# Patient Record
Sex: Female | Born: 1955
Health system: Southern US, Community
[De-identification: ages and names within clinical notes are randomized; demographics above are authoritative.]

## PROBLEM LIST (undated history)

## (undated) DIAGNOSIS — F329 Major depressive disorder, single episode, unspecified: Secondary | ICD-10-CM

## (undated) DIAGNOSIS — F32A Depression, unspecified: Secondary | ICD-10-CM

## (undated) DIAGNOSIS — F410 Panic disorder [episodic paroxysmal anxiety] without agoraphobia: Secondary | ICD-10-CM

## (undated) DIAGNOSIS — I10 Essential (primary) hypertension: Secondary | ICD-10-CM

## (undated) DIAGNOSIS — F419 Anxiety disorder, unspecified: Secondary | ICD-10-CM

## (undated) DIAGNOSIS — M1990 Unspecified osteoarthritis, unspecified site: Secondary | ICD-10-CM

## (undated) DIAGNOSIS — F1910 Other psychoactive substance abuse, uncomplicated: Secondary | ICD-10-CM

## (undated) DIAGNOSIS — E7800 Pure hypercholesterolemia, unspecified: Secondary | ICD-10-CM

## (undated) DIAGNOSIS — G8929 Other chronic pain: Secondary | ICD-10-CM

## (undated) HISTORY — PX: KNEE SURGERY: SHX244

## (undated) HISTORY — PX: TUBAL LIGATION: SHX77

## (undated) HISTORY — PX: OTHER SURGICAL HISTORY: SHX169

## (undated) HISTORY — PX: CHOLECYSTECTOMY: SHX55

## (undated) HISTORY — PX: CERVICAL FUSION: SHX112

## (undated) HISTORY — PX: APPENDECTOMY: SHX54

---

## 2017-05-16 ENCOUNTER — Emergency Department (HOSPITAL_BASED_OUTPATIENT_CLINIC_OR_DEPARTMENT_OTHER)
Admission: EM | Admit: 2017-05-16 | Discharge: 2017-05-16 | Disposition: A | Discharge status: Home / Self Care | Payer: BLUE CROSS/BLUE SHIELD | Attending: Emergency Medicine | Admitting: Emergency Medicine

## 2017-05-16 ENCOUNTER — Encounter (HOSPITAL_BASED_OUTPATIENT_CLINIC_OR_DEPARTMENT_OTHER): Payer: Self-pay | Admitting: *Deleted

## 2017-05-16 DIAGNOSIS — I10 Essential (primary) hypertension: Secondary | ICD-10-CM | POA: Diagnosis not present

## 2017-05-16 DIAGNOSIS — F419 Anxiety disorder, unspecified: Secondary | ICD-10-CM | POA: Diagnosis present

## 2017-05-16 DIAGNOSIS — R202 Paresthesia of skin: Secondary | ICD-10-CM | POA: Diagnosis not present

## 2017-05-16 DIAGNOSIS — Z79899 Other long term (current) drug therapy: Secondary | ICD-10-CM | POA: Insufficient documentation

## 2017-05-16 HISTORY — DX: Panic disorder (episodic paroxysmal anxiety): F41.0

## 2017-05-16 HISTORY — DX: Unspecified osteoarthritis, unspecified site: M19.90

## 2017-05-16 HISTORY — DX: Other psychoactive substance abuse, uncomplicated: F19.10

## 2017-05-16 HISTORY — DX: Essential (primary) hypertension: I10

## 2017-05-16 HISTORY — DX: Major depressive disorder, single episode, unspecified: F32.9

## 2017-05-16 HISTORY — DX: Other chronic pain: G89.29

## 2017-05-16 HISTORY — DX: Depression, unspecified: F32.A

## 2017-05-16 HISTORY — DX: Pure hypercholesterolemia, unspecified: E78.00

## 2017-05-16 HISTORY — DX: Anxiety disorder, unspecified: F41.9

## 2017-05-16 LAB — BASIC METABOLIC PANEL
ANION GAP: 10 (ref 5–15)
BUN: 15 mg/dL (ref 6–20)
CALCIUM: 8.8 mg/dL — AB (ref 8.9–10.3)
CO2: 27 mmol/L (ref 22–32)
Chloride: 103 mmol/L (ref 101–111)
Creatinine, Ser: 0.73 mg/dL (ref 0.44–1.00)
GLUCOSE: 111 mg/dL — AB (ref 65–99)
Potassium: 4 mmol/L (ref 3.5–5.1)
Sodium: 140 mmol/L (ref 135–145)

## 2017-05-16 LAB — CBC WITH DIFFERENTIAL/PLATELET
BASOS PCT: 0 %
Basophils Absolute: 0 10*3/uL (ref 0.0–0.1)
EOS ABS: 0 10*3/uL (ref 0.0–0.7)
EOS PCT: 1 %
HCT: 40.9 % (ref 36.0–46.0)
HEMOGLOBIN: 13.3 g/dL (ref 12.0–15.0)
Lymphocytes Relative: 29 %
Lymphs Abs: 2.3 10*3/uL (ref 0.7–4.0)
MCH: 32.3 pg (ref 26.0–34.0)
MCHC: 32.5 g/dL (ref 30.0–36.0)
MCV: 99.3 fL (ref 78.0–100.0)
MONO ABS: 0.6 10*3/uL (ref 0.1–1.0)
MONOS PCT: 8 %
NEUTROS PCT: 62 %
Neutro Abs: 4.7 10*3/uL (ref 1.7–7.7)
Platelets: 394 10*3/uL (ref 150–400)
RBC: 4.12 MIL/uL (ref 3.87–5.11)
RDW: 13.6 % (ref 11.5–15.5)
WBC: 7.7 10*3/uL (ref 4.0–10.5)

## 2017-05-16 LAB — TROPONIN I: Troponin I: <0.03 ng/mL (ref <0.03)

## 2017-05-16 MED ORDER — AMLODIPINE BESYLATE 5 MG PO TABS
5.0000 mg | ORAL_TABLET | Freq: Once | ORAL | Status: AC
  Administered 2017-05-16: 5 mg via ORAL
  Filled 2017-05-16: qty 1

## 2017-05-16 MED ORDER — AMLODIPINE BESYLATE 5 MG PO TABS: 5.0000 mg | ORAL_TABLET | Freq: Every day | ORAL | 0 refills | Status: AC

## 2017-05-16 NOTE — ED Provider Notes (Signed)
[facility] Provider Note   CSN: [id number] Arrival date & time: 05/16/17  0630     History   Chief Complaint Chief Complaint  Patient presents with  . Hypertension    HPI Diane Golden is a 61 y.o. female.  HPI Patient reports that she has problems with chronic anxiety. She went through detox at the end of June for benzodiazepine and opioid dependency. Patient reports for some 40 years she had been chronically treated with benzodiazepines for anxiety and opioids for pain medication. She reports she come to a point where she could not even get out of bed and spent almost the last 6 years nearly bedbound. Since going to detox she is now getting out more and is going shopping with family members. Her activity level has increased significantly. Patient reports that she still deals with severe anxiety and often has tremulousness. She reports that starting last night she started experiencing the feeling of both of her hands being tingly and that her tongue and mouth area felt tingly. She reports it made her worry about stroke. She reports she has checked her blood pressure multiple times overnight and it is consistently been staying over 160 systolic. Patient reports that she is compliant with her Toprol. She reports only medicine she has now for anxiety is her Neurontin. Patient denies chest pain, shortness of breath. She has not had lower extremity weakness or gait dysfunction. No headache or visual change. Past Medical History:  Diagnosis Date  . Anxiety   . Arthritis   . Chronic pain   . Depression   . Hypercholesteremia   . Hypertension   . Panic attacks   . Substance abuse    opiates and benzos    There are no active problems to display for this patient.   Past Surgical History:  Procedure Laterality Date  . APPENDECTOMY    . arm surgery Right   . CERVICAL FUSION    . CHOLECYSTECTOMY    . KNEE SURGERY Left   . TUBAL LIGATION      OB History    No data  available       Home Medications    Prior to Admission medications   Medication Sig Start Date End Date Taking? Authorizing Provider  atorvastatin (LIPITOR) 20 MG tablet Take 20 mg by mouth daily.   Yes [provider]  carbamazepine (TEGRETOL) 200 MG tablet Take 200 mg by mouth 2 (two) times daily.   Yes [provider]  divalproex (DEPAKOTE ER) 500 MG 24 hr tablet Take by mouth daily.   Yes [provider]  ezetimibe (ZETIA) 10 MG tablet Take 10 mg by mouth daily.   Yes [provider]  folic acid (FOLVITE) 1 MG tablet Take 1 mg by mouth daily.   Yes [provider]  gabapentin (NEURONTIN) 300 MG capsule Take 600 mg by mouth 3 (three) times daily.   Yes [provider]  metoprolol succinate (TOPROL-XL) 100 MG 24 hr tablet Take 100 mg by mouth daily. Take with or immediately following a meal.   Yes [provider]  omeprazole (PRILOSEC) 40 MG capsule Take 40 mg by mouth daily.   Yes [provider]  pregabalin (LYRICA) 200 MG capsule Take 200 mg by mouth 3 (three) times daily.   Yes [provider]  QUEtiapine (SEROQUEL) 100 MG tablet Take 100 mg by mouth at bedtime.   Yes [provider]  amLODipine (NORVASC) 5 MG tablet Take 1 tablet (  5 mg total) by mouth daily. 05/16/17   Charlesetta Shanks, MD    Family History No family history on file.  Social History Social History  Substance Use Topics  . Smoking status: Never Smoker  . Smokeless tobacco: Never Used  . Alcohol use No     Allergies   Patient has no known allergies.   Review of Systems Review of Systems 10 Systems reviewed and are negative for acute change except as noted in the HPI.   Physical Exam Updated Vital Signs BP (!) 175/69   Pulse 62   Temp 99 F (37.2 C) (Oral)   Resp 17   Ht 5\' 6"  (1.676 m)   Wt 68 kg (150 lb)   SpO2 93%   BMI 24.21 kg/m   Physical Exam  Constitutional: She is oriented to person, place, and  time. She appears well-developed and well-nourished. No distress.  HENT:  Head: Normocephalic and atraumatic.  Right Ear: External ear normal.  Left Ear: External ear normal.  Nose: Nose normal.  Mouth/Throat: Oropharynx is clear and moist.  Eyes: Pupils are equal, round, and reactive to light. Conjunctivae and EOM are normal.  Neck: Neck supple.  Cardiovascular: Normal rate, regular rhythm, normal heart sounds and intact distal pulses.   No murmur heard. Pulmonary/Chest: Effort normal and breath sounds normal. No respiratory distress.  Abdominal: Soft. There is no tenderness.  Musculoskeletal: She exhibits no edema or tenderness.  Neurological: She is alert and oriented to person, place, and time. No cranial nerve deficit. She exhibits normal muscle tone. Coordination normal.  Patient is cognitively normal. Speech is not slurred. Motor strength is 5\54 extremities. Sensation intact to light touch.  Skin: Skin is warm and dry.  Psychiatric: She has a normal mood and affect.  Nursing note and vitals reviewed.    ED Treatments / Results  Labs (all labs ordered are listed, but only abnormal results are displayed) Labs Reviewed  BASIC METABOLIC PANEL - Abnormal; Notable for the following:       Result Value   Glucose, Bld 111 (*)    Calcium 8.8 (*)    All other components within normal limits  TROPONIN I  CBC WITH DIFFERENTIAL/PLATELET    EKG  EKG Interpretation  Date/Time:  Sunday May 16 2017 10:00:30 EDT Ventricular Rate:  61 PR Interval:    QRS Duration: 78 QT Interval:  472 QTC Calculation: 476 R Axis:   60 Text Interpretation:  Sinus rhythm Confirmed by Charlesetta Shanks [phone]) on 05/16/2017 10:11:10 AM       Radiology No results found.  Procedures Procedures (including critical care time)  Medications Ordered in ED Medications  amLODipine (NORVASC) tablet 5 mg (5 mg Oral Given 05/16/17 1038)     Initial Impression / Assessment and Plan / ED Course  I  have reviewed the triage vital signs and the nursing notes.  Pertinent labs & imaging results that were available during my care of the patient were reviewed by me and considered in my medical decision making (see chart for details).     Final Clinical Impressions(s) / ED Diagnoses   Final diagnoses:  Essential hypertension  Anxiety  Patient has had combination of symptoms of paresthesia of both upper extremities and her oral region with elevated blood pressure. Distribution of neurologic symptoms is atypical for CVA\TIA. The patient has been dealing with chronic anxiety for many many years and now is no longer being treated with benzodiazepine. I do suspect paresthesia of the upper extremities  and perioral paresthesia is in association with anxiety and panic attacks. Patient describes getting very tremulous frequently. She has been measuring blood pressure overnight and has been hypertensive. Patient does not have headache or visual changes association with this she also does not have chest pain or dyspnea. I do not suspect hypertensive urgency\emergency. Patient was treated with 5 mg of Norvasc with improved blood pressure. Due to severity of the patient's dependency on opioids and benzodiazepine to the point of near incapacitation, I do not wish to start treating with either of these medications. I will add amlodipine 5 mg daily and the patient will continue her Toprol. She is counseled on very close follow-up with her PCP for blood pressure monitoring and also counseled on the necessity to return if any stroke like symptoms develop. Her close companion is with her and we reviewed all of these plans. I feel she is stable for discharge with return precautions provided.  New Prescriptions New Prescriptions   AMLODIPINE (NORVASC) 5 MG TABLET    Take 1 tablet (5 mg total) by mouth daily.     Charlesetta Shanks, MD 05/16/17 1310

## 2017-05-16 NOTE — ED Notes (Signed)
ED Provider at bedside. 

## 2017-05-16 NOTE — ED Notes (Signed)
Pt on cardiac monitor and auto VS 

## 2017-05-16 NOTE — ED Triage Notes (Signed)
Pt presents with bil hand tingling sensation and tongue tingling that began last night. Reports checking BP at home with SBP >200. Denies sob, chest pain. Reports transient dizziness last night that resolved quickly. Denies n/v. Reports recent detox from opiate addiction (end of June).

## 2019-04-25 DIAGNOSIS — H8112 Benign paroxysmal vertigo, left ear: Secondary | ICD-10-CM | POA: Diagnosis not present

## 2019-04-25 DIAGNOSIS — R51 Headache: Secondary | ICD-10-CM | POA: Diagnosis not present

## 2019-04-25 DIAGNOSIS — H6122 Impacted cerumen, left ear: Secondary | ICD-10-CM | POA: Diagnosis not present

## 2019-05-03 DIAGNOSIS — M25561 Pain in right knee: Secondary | ICD-10-CM | POA: Diagnosis not present

## 2019-05-03 DIAGNOSIS — Z86010 Personal history of colonic polyps: Secondary | ICD-10-CM | POA: Diagnosis not present

## 2019-05-03 DIAGNOSIS — K529 Noninfective gastroenteritis and colitis, unspecified: Secondary | ICD-10-CM | POA: Diagnosis not present

## 2019-05-03 DIAGNOSIS — G8929 Other chronic pain: Secondary | ICD-10-CM | POA: Diagnosis not present

## 2019-05-03 DIAGNOSIS — M1712 Unilateral primary osteoarthritis, left knee: Secondary | ICD-10-CM | POA: Diagnosis not present

## 2019-05-03 DIAGNOSIS — M25562 Pain in left knee: Secondary | ICD-10-CM | POA: Diagnosis not present

## 2019-05-03 DIAGNOSIS — K219 Gastro-esophageal reflux disease without esophagitis: Secondary | ICD-10-CM | POA: Diagnosis not present

## 2019-05-03 DIAGNOSIS — Z8371 Family history of colonic polyps: Secondary | ICD-10-CM | POA: Diagnosis not present

## 2019-05-23 DIAGNOSIS — E7800 Pure hypercholesterolemia, unspecified: Secondary | ICD-10-CM | POA: Diagnosis not present

## 2019-05-23 DIAGNOSIS — D539 Nutritional anemia, unspecified: Secondary | ICD-10-CM | POA: Diagnosis not present

## 2019-05-23 DIAGNOSIS — M129 Arthropathy, unspecified: Secondary | ICD-10-CM | POA: Diagnosis not present

## 2019-05-23 DIAGNOSIS — R300 Dysuria: Secondary | ICD-10-CM | POA: Diagnosis not present

## 2019-05-23 DIAGNOSIS — Z79899 Other long term (current) drug therapy: Secondary | ICD-10-CM | POA: Diagnosis not present

## 2019-05-23 DIAGNOSIS — Z0000 Encounter for general adult medical examination without abnormal findings: Secondary | ICD-10-CM | POA: Diagnosis not present

## 2019-05-23 DIAGNOSIS — Z1159 Encounter for screening for other viral diseases: Secondary | ICD-10-CM | POA: Diagnosis not present

## 2019-05-23 DIAGNOSIS — E559 Vitamin D deficiency, unspecified: Secondary | ICD-10-CM | POA: Diagnosis not present

## 2019-05-23 DIAGNOSIS — M545 Low back pain: Secondary | ICD-10-CM | POA: Diagnosis not present

## 2019-05-23 DIAGNOSIS — R5383 Other fatigue: Secondary | ICD-10-CM | POA: Diagnosis not present

## 2019-06-05 DIAGNOSIS — G4700 Insomnia, unspecified: Secondary | ICD-10-CM | POA: Diagnosis not present

## 2019-06-05 DIAGNOSIS — M545 Low back pain: Secondary | ICD-10-CM | POA: Diagnosis not present

## 2019-06-05 DIAGNOSIS — F4329 Adjustment disorder with other symptoms: Secondary | ICD-10-CM | POA: Diagnosis not present

## 2019-06-05 DIAGNOSIS — F411 Generalized anxiety disorder: Secondary | ICD-10-CM | POA: Diagnosis not present

## 2019-06-05 DIAGNOSIS — F410 Panic disorder [episodic paroxysmal anxiety] without agoraphobia: Secondary | ICD-10-CM | POA: Diagnosis not present

## 2019-06-06 DIAGNOSIS — M79604 Pain in right leg: Secondary | ICD-10-CM | POA: Diagnosis not present

## 2019-06-06 DIAGNOSIS — N183 Chronic kidney disease, stage 3 (moderate): Secondary | ICD-10-CM | POA: Diagnosis not present

## 2019-06-06 DIAGNOSIS — R600 Localized edema: Secondary | ICD-10-CM | POA: Diagnosis not present

## 2019-06-06 DIAGNOSIS — M79605 Pain in left leg: Secondary | ICD-10-CM | POA: Diagnosis not present

## 2019-06-20 DIAGNOSIS — M5136 Other intervertebral disc degeneration, lumbar region: Secondary | ICD-10-CM | POA: Diagnosis not present

## 2019-06-20 DIAGNOSIS — M25561 Pain in right knee: Secondary | ICD-10-CM | POA: Diagnosis not present

## 2019-06-20 DIAGNOSIS — Z23 Encounter for immunization: Secondary | ICD-10-CM | POA: Diagnosis not present

## 2019-06-20 DIAGNOSIS — M25562 Pain in left knee: Secondary | ICD-10-CM | POA: Diagnosis not present

## 2019-06-20 DIAGNOSIS — M545 Low back pain: Secondary | ICD-10-CM | POA: Diagnosis not present

## 2019-06-22 DIAGNOSIS — R600 Localized edema: Secondary | ICD-10-CM | POA: Diagnosis not present

## 2019-07-03 DIAGNOSIS — M25521 Pain in right elbow: Secondary | ICD-10-CM | POA: Diagnosis not present

## 2019-07-03 DIAGNOSIS — M25551 Pain in right hip: Secondary | ICD-10-CM | POA: Diagnosis not present

## 2019-07-03 DIAGNOSIS — W19XXXA Unspecified fall, initial encounter: Secondary | ICD-10-CM | POA: Diagnosis not present

## 2019-07-04 DIAGNOSIS — G4700 Insomnia, unspecified: Secondary | ICD-10-CM | POA: Diagnosis not present

## 2019-07-04 DIAGNOSIS — F4329 Adjustment disorder with other symptoms: Secondary | ICD-10-CM | POA: Diagnosis not present

## 2019-07-04 DIAGNOSIS — F411 Generalized anxiety disorder: Secondary | ICD-10-CM | POA: Diagnosis not present

## 2019-07-04 DIAGNOSIS — F410 Panic disorder [episodic paroxysmal anxiety] without agoraphobia: Secondary | ICD-10-CM | POA: Diagnosis not present

## 2019-07-11 DIAGNOSIS — F419 Anxiety disorder, unspecified: Secondary | ICD-10-CM | POA: Diagnosis not present

## 2019-07-11 DIAGNOSIS — M545 Low back pain: Secondary | ICD-10-CM | POA: Diagnosis not present

## 2019-07-11 DIAGNOSIS — Z79899 Other long term (current) drug therapy: Secondary | ICD-10-CM | POA: Diagnosis not present

## 2019-07-11 DIAGNOSIS — Z1159 Encounter for screening for other viral diseases: Secondary | ICD-10-CM | POA: Diagnosis not present

## 2019-07-11 DIAGNOSIS — M25551 Pain in right hip: Secondary | ICD-10-CM | POA: Diagnosis not present

## 2019-07-19 DIAGNOSIS — Z7189 Other specified counseling: Secondary | ICD-10-CM | POA: Diagnosis not present

## 2019-07-19 DIAGNOSIS — M25551 Pain in right hip: Secondary | ICD-10-CM | POA: Diagnosis not present

## 2019-07-19 DIAGNOSIS — M5136 Other intervertebral disc degeneration, lumbar region: Secondary | ICD-10-CM | POA: Diagnosis not present

## 2019-07-19 DIAGNOSIS — M5440 Lumbago with sciatica, unspecified side: Secondary | ICD-10-CM | POA: Diagnosis not present

## 2019-07-20 ENCOUNTER — Encounter: Payer: Self-pay | Admitting: Diagnostic Neuroimaging

## 2019-07-20 ENCOUNTER — Encounter

## 2019-07-24 ENCOUNTER — Encounter: Payer: Self-pay | Admitting: Diagnostic Neuroimaging

## 2019-08-01 DIAGNOSIS — F410 Panic disorder [episodic paroxysmal anxiety] without agoraphobia: Secondary | ICD-10-CM | POA: Diagnosis not present

## 2019-08-01 DIAGNOSIS — G4700 Insomnia, unspecified: Secondary | ICD-10-CM | POA: Diagnosis not present

## 2019-08-01 DIAGNOSIS — F411 Generalized anxiety disorder: Secondary | ICD-10-CM | POA: Diagnosis not present

## 2019-08-01 DIAGNOSIS — F331 Major depressive disorder, recurrent, moderate: Secondary | ICD-10-CM | POA: Diagnosis not present

## 2019-08-14 DIAGNOSIS — Z79899 Other long term (current) drug therapy: Secondary | ICD-10-CM | POA: Diagnosis not present

## 2019-08-14 DIAGNOSIS — M792 Neuralgia and neuritis, unspecified: Secondary | ICD-10-CM | POA: Diagnosis not present

## 2019-08-14 DIAGNOSIS — M25551 Pain in right hip: Secondary | ICD-10-CM | POA: Diagnosis not present

## 2019-08-14 DIAGNOSIS — M5136 Other intervertebral disc degeneration, lumbar region: Secondary | ICD-10-CM | POA: Diagnosis not present

## 2019-08-14 DIAGNOSIS — M5440 Lumbago with sciatica, unspecified side: Secondary | ICD-10-CM | POA: Diagnosis not present

## 2019-08-21 DIAGNOSIS — G8929 Other chronic pain: Secondary | ICD-10-CM | POA: Diagnosis not present

## 2019-08-21 DIAGNOSIS — G894 Chronic pain syndrome: Secondary | ICD-10-CM | POA: Diagnosis not present

## 2019-08-21 DIAGNOSIS — M47816 Spondylosis without myelopathy or radiculopathy, lumbar region: Secondary | ICD-10-CM | POA: Diagnosis not present

## 2019-08-21 DIAGNOSIS — M25551 Pain in right hip: Secondary | ICD-10-CM | POA: Diagnosis not present

## 2019-08-21 DIAGNOSIS — S79911A Unspecified injury of right hip, initial encounter: Secondary | ICD-10-CM | POA: Diagnosis not present

## 2019-08-21 DIAGNOSIS — M25561 Pain in right knee: Secondary | ICD-10-CM | POA: Diagnosis not present

## 2019-08-21 DIAGNOSIS — M25562 Pain in left knee: Secondary | ICD-10-CM | POA: Diagnosis not present

## 2019-08-26 ENCOUNTER — Encounter (HOSPITAL_BASED_OUTPATIENT_CLINIC_OR_DEPARTMENT_OTHER): Payer: Self-pay | Admitting: Emergency Medicine

## 2019-08-26 ENCOUNTER — Emergency Department (HOSPITAL_BASED_OUTPATIENT_CLINIC_OR_DEPARTMENT_OTHER)
Admission: EM | Admit: 2019-08-26 | Discharge: 2019-08-26 | Disposition: A | Discharge status: Home / Self Care | Payer: PPO | Attending: Emergency Medicine | Admitting: Emergency Medicine

## 2019-08-26 ENCOUNTER — Emergency Department (HOSPITAL_BASED_OUTPATIENT_CLINIC_OR_DEPARTMENT_OTHER): Payer: PPO

## 2019-08-26 ENCOUNTER — Other Ambulatory Visit: Payer: Self-pay

## 2019-08-26 DIAGNOSIS — I10 Essential (primary) hypertension: Secondary | ICD-10-CM | POA: Insufficient documentation

## 2019-08-26 DIAGNOSIS — J189 Pneumonia, unspecified organism: Secondary | ICD-10-CM | POA: Insufficient documentation

## 2019-08-26 DIAGNOSIS — Z79899 Other long term (current) drug therapy: Secondary | ICD-10-CM | POA: Diagnosis not present

## 2019-08-26 DIAGNOSIS — Z20828 Contact with and (suspected) exposure to other viral communicable diseases: Secondary | ICD-10-CM | POA: Diagnosis not present

## 2019-08-26 DIAGNOSIS — Z886 Allergy status to analgesic agent status: Secondary | ICD-10-CM | POA: Insufficient documentation

## 2019-08-26 DIAGNOSIS — E876 Hypokalemia: Secondary | ICD-10-CM | POA: Insufficient documentation

## 2019-08-26 DIAGNOSIS — E782 Mixed hyperlipidemia: Secondary | ICD-10-CM | POA: Insufficient documentation

## 2019-08-26 DIAGNOSIS — R05 Cough: Secondary | ICD-10-CM | POA: Diagnosis not present

## 2019-08-26 LAB — CBC WITH DIFFERENTIAL/PLATELET
Abs Immature Granulocytes: 1.74 10*3/uL — ABNORMAL HIGH (ref 0.00–0.07)
Basophils Absolute: 0.1 10*3/uL (ref 0.0–0.1)
Basophils Relative: 0 %
Eosinophils Absolute: 0 10*3/uL (ref 0.0–0.5)
Eosinophils Relative: 0 %
HCT: 35.7 % — ABNORMAL LOW (ref 36.0–46.0)
Hemoglobin: 10.7 g/dL — ABNORMAL LOW (ref 12.0–15.0)
Immature Granulocytes: 9 %
Lymphocytes Relative: 4 %
Lymphs Abs: 0.8 10*3/uL (ref 0.7–4.0)
MCH: 29.7 pg (ref 26.0–34.0)
MCHC: 30 g/dL (ref 30.0–36.0)
MCV: 99.2 fL (ref 80.0–100.0)
Monocytes Absolute: 0.4 10*3/uL (ref 0.1–1.0)
Monocytes Relative: 2 %
Neutro Abs: 16.9 10*3/uL — ABNORMAL HIGH (ref 1.7–7.7)
Neutrophils Relative %: 85 %
Platelets: 507 10*3/uL — ABNORMAL HIGH (ref 150–400)
RBC: 3.6 MIL/uL — ABNORMAL LOW (ref 3.87–5.11)
RDW: 15.3 % (ref 11.5–15.5)
Smear Review: INCREASED
WBC: 19.7 10*3/uL — ABNORMAL HIGH (ref 4.0–10.5)
nRBC: 0 % (ref 0.0–0.2)

## 2019-08-26 LAB — BASIC METABOLIC PANEL
Anion gap: 12 (ref 5–15)
BUN: 12 mg/dL (ref 8–23)
CO2: 22 mmol/L (ref 22–32)
Calcium: 8.4 mg/dL — ABNORMAL LOW (ref 8.9–10.3)
Chloride: 104 mmol/L (ref 98–111)
Creatinine, Ser: 0.72 mg/dL (ref 0.44–1.00)
GFR calc Af Amer: >60 mL/min (ref >60)
GFR calc non Af Amer: >60 mL/min (ref >60)
Glucose, Bld: 140 mg/dL — ABNORMAL HIGH (ref 70–99)
Potassium: 2.9 mmol/L — ABNORMAL LOW (ref 3.5–5.1)
Sodium: 138 mmol/L (ref 135–145)

## 2019-08-26 LAB — LACTIC ACID, PLASMA: Lactic Acid, Venous: 1.8 mmol/L (ref 0.5–1.9)

## 2019-08-26 MED ORDER — LEVOFLOXACIN IN D5W 750 MG/150ML IV SOLN
750.0000 mg | Freq: Once | INTRAVENOUS | Status: AC
  Administered 2019-08-26: 750 mg via INTRAVENOUS
  Filled 2019-08-26: qty 150

## 2019-08-26 MED ORDER — POTASSIUM CHLORIDE CRYS ER 20 MEQ PO TBCR
40.0000 meq | EXTENDED_RELEASE_TABLET | Freq: Once | ORAL | Status: AC
  Administered 2019-08-26: 40 meq via ORAL
  Filled 2019-08-26: qty 2

## 2019-08-26 MED ORDER — LEVOFLOXACIN 750 MG PO TABS: 750.0000 mg | ORAL_TABLET | Freq: Every day | ORAL | 0 refills | Status: AC

## 2019-08-26 MED ORDER — SODIUM CHLORIDE 0.9 % IV BOLUS
1000.0000 mL | Freq: Once | INTRAVENOUS | Status: AC
  Administered 2019-08-26: 19:00:00 1000 mL via INTRAVENOUS

## 2019-08-26 NOTE — ED Notes (Signed)
ED Provider at bedside. 

## 2019-08-26 NOTE — Discharge Instructions (Addendum)
Take levaquin daily for a week   Stay hydrated.   Take your potassium as prescribed by your doctor   Your COVID test is pending and you need to stay home until the results come back negative   SEe your doctor. Repeat CBC and chemistry in a week   Return to ER if you have worse trouble breathing, fever, cough.      Person Under Monitoring Name: Diane Golden  Location: [address] [place] [address]   Infection Prevention Recommendations for Individuals Confirmed to have, or Being Evaluated for, 2019 Novel Coronavirus (COVID-19) Infection Who Receive Care at Home  Individuals who are confirmed to have, or are being evaluated for, COVID-19 should follow the prevention steps below until a healthcare provider or local or state health department says they can return to normal activities.  Stay home except to get medical care You should restrict activities outside your home, except for getting medical care. Do not go to work, school, or public areas, and do not use public transportation or taxis.  Call ahead before visiting your doctor Before your medical appointment, call the healthcare provider and tell them that you have, or are being evaluated for, COVID-19 infection. This will help the healthcare providers office take steps to keep other people from getting infected. Ask your healthcare provider to call the local or state health department.  Monitor your symptoms Seek prompt medical attention if your illness is worsening (e.g., difficulty breathing). Before going to your medical appointment, call the healthcare provider and tell them that you have, or are being evaluated for, COVID-19 infection. Ask your healthcare provider to call the local or state health department.  Wear a facemask You should wear a facemask that covers your nose and mouth when you are in the same room with other people and when you visit a healthcare provider. People who live with or visit you should  also wear a facemask while they are in the same room with you.  Separate yourself from other people in your home As much as possible, you should stay in a different room from other people in your home. Also, you should use a separate bathroom, if available.  Avoid sharing household items You should not share dishes, drinking glasses, cups, eating utensils, towels, bedding, or other items with other people in your home. After using these items, you should wash them thoroughly with soap and water.  Cover your coughs and sneezes Cover your mouth and nose with a tissue when you cough or sneeze, or you can cough or sneeze into your sleeve. Throw used tissues in a lined trash can, and immediately wash your hands with soap and water for at least 20 seconds or use an alcohol-based hand rub.  Wash your [facility] your hands often and thoroughly with soap and water for at least 20 seconds. You can use an alcohol-based hand sanitizer if soap and water are not available and if your hands are not visibly dirty. Avoid touching your eyes, nose, and mouth with unwashed hands.   Prevention Steps for Caregivers and Household Members of Individuals Confirmed to have, or Being Evaluated for, COVID-19 Infection Being Cared for in the Home  If you live with, or provide care at home for, a person confirmed to have, or being evaluated for, COVID-19 infection please follow these guidelines to prevent infection:  Follow healthcare providers instructions Make sure that you understand and can help the patient follow any healthcare provider instructions for all  care.  Provide for the patients basic needs You should help the patient with basic needs in the home and provide support for getting groceries, prescriptions, and other personal needs.  Monitor the patients symptoms If they are getting sicker, call his or her medical provider and tell them that the patient has, or is being evaluated for, COVID-19  infection. This will help the healthcare providers office take steps to keep other people from getting infected. Ask the healthcare provider to call the local or state health department.  Limit the number of people who have contact with the patient If possible, have only one caregiver for the patient. Other household members should stay in another home or place of residence. If this is not possible, they should stay in another room, or be separated from the patient as much as possible. Use a separate bathroom, if available. Restrict visitors who do not have an essential need to be in the home.  Keep older adults, very young children, and other sick people away from the patient Keep older adults, very young children, and those who have compromised immune systems or chronic health conditions away from the patient. This includes people with chronic heart, lung, or kidney conditions, diabetes, and cancer.  Ensure good ventilation Make sure that shared spaces in the home have good air flow, such as from an air conditioner or an opened window, weather permitting.  Wash your hands often Wash your hands often and thoroughly with soap and water for at least 20 seconds. You can use an alcohol based hand sanitizer if soap and water are not available and if your hands are not visibly dirty. Avoid touching your eyes, nose, and mouth with unwashed hands. Use disposable paper towels to dry your hands. If not available, use dedicated cloth towels and replace them when they become wet.  Wear a facemask and gloves Wear a disposable facemask at all times in the room and gloves when you touch or have contact with the patients blood, body fluids, and/or secretions or excretions, such as sweat, saliva, sputum, nasal mucus, vomit, urine, or feces.  Ensure the mask fits over your nose and mouth tightly, and do not touch it during use. Throw out disposable facemasks and gloves after using them. Do not reuse. Wash  your hands immediately after removing your facemask and gloves. If your personal clothing becomes contaminated, carefully remove clothing and launder. Wash your hands after handling contaminated clothing. Place all used disposable facemasks, gloves, and other waste in a lined container before disposing them with other household waste. Remove gloves and wash your hands immediately after handling these items.  Do not share dishes, glasses, or other household items with the patient Avoid sharing household items. You should not share dishes, drinking glasses, cups, eating utensils, towels, bedding, or other items with a patient who is confirmed to have, or being evaluated for, COVID-19 infection. After the person uses these items, you should wash them thoroughly with soap and water.  Wash laundry thoroughly Immediately remove and wash clothes or bedding that have blood, body fluids, and/or secretions or excretions, such as sweat, saliva, sputum, nasal mucus, vomit, urine, or feces, on them. Wear gloves when handling laundry from the patient. Read and follow directions on labels of laundry or clothing items and detergent. In general, wash and dry with the warmest temperatures recommended on the label.  Clean all areas the individual has used often Clean all touchable surfaces, such as counters, tabletops, doorknobs, bathroom fixtures, toilets, phones,  keyboards, tablets, and bedside tables, every day. Also, clean any surfaces that may have blood, body fluids, and/or secretions or excretions on them. Wear gloves when cleaning surfaces the patient has come in contact with. Use a diluted bleach solution (e.g., dilute bleach with 1 part bleach and 10 parts water) or a household disinfectant with a label that says EPA-registered for coronaviruses. To make a bleach solution at home, add 1 tablespoon of bleach to 1 quart (4 cups) of water. For a larger supply, add  cup of bleach to 1 gallon (16 cups) of  water. Read labels of cleaning products and follow recommendations provided on product labels. Labels contain instructions for safe and effective use of the cleaning product including precautions you should take when applying the product, such as wearing gloves or eye protection and making sure you have good ventilation during use of the product. Remove gloves and wash hands immediately after cleaning.  Monitor yourself for signs and symptoms of illness Caregivers and household members are considered close contacts, should monitor their health, and will be asked to limit movement outside of the home to the extent possible. Follow the monitoring steps for close contacts listed on the symptom monitoring form.   ? If you have additional questions, contact your local health department or call the epidemiologist on call at [phone] (available 24/7). ? This guidance is subject to change. For the most up-to-date guidance from [facility], please refer to their website: [email or web address]

## 2019-08-26 NOTE — ED Triage Notes (Signed)
Patient states that she has had a cough for 4 weeks. The patient states that she is having pain in her back as well. Denies any SOB with the cough

## 2019-08-26 NOTE — ED Provider Notes (Signed)
[facility] EMERGENCY DEPARTMENT Provider Note   CSN: [id number] Arrival date & time: 08/26/19  1658     History   Chief Complaint Chief Complaint  Patient presents with  . Cough    HPI Diane Golden is a 63 y.o. female hx of HL, HTN, who presented with cough, congestion.  Patient states that she has been coughing for the last 4 weeks and has been getting worse .  Has some back pain when she coughs .  Patient has some low-grade temperature but denies any fever .  Denies any recent travel patient denies any known Covid contacts.  Patient states that several years ago, she was septic from pneumonia and was admitted.  However this time she denies any fever.     The history is provided by the patient.    Past Medical History:  Diagnosis Date  . Anxiety   . Arthritis   . Chronic pain   . Depression   . Hypercholesteremia   . Hypertension   . Panic attacks   . Substance abuse ([place])    opiates and benzos    There are no active problems to display for this patient.   Past Surgical History:  Procedure Laterality Date  . APPENDECTOMY    . arm surgery Right   . CERVICAL FUSION    . CHOLECYSTECTOMY    . KNEE SURGERY Left   . TUBAL LIGATION       OB History   No obstetric history on file.      Home Medications    Prior to Admission medications   Medication Sig Start Date End Date Taking? Authorizing Provider  amLODipine (NORVASC) 5 MG tablet Take 1 tablet (5 mg total) by mouth daily. 05/16/17   Charlesetta Shanks, MD  atorvastatin (LIPITOR) 20 MG tablet Take 20 mg by mouth daily.    [provider]  carbamazepine (TEGRETOL) 200 MG tablet Take 200 mg by mouth 2 (two) times daily.    [provider]  divalproex (DEPAKOTE ER) 500 MG 24 hr tablet Take by mouth daily.    [provider]  ezetimibe (ZETIA) 10 MG tablet Take 10 mg by mouth daily.    [provider]  folic acid (FOLVITE) 1 MG tablet Take 1 mg by mouth daily.     [provider]  gabapentin (NEURONTIN) 300 MG capsule Take 600 mg by mouth 3 (three) times daily.    [provider]  metoprolol succinate (TOPROL-XL) 100 MG 24 hr tablet Take 100 mg by mouth daily. Take with or immediately following a meal.    [provider]  omeprazole (PRILOSEC) 40 MG capsule Take 40 mg by mouth daily.    [provider]  pregabalin (LYRICA) 200 MG capsule Take 200 mg by mouth 3 (three) times daily.    [provider]  QUEtiapine (SEROQUEL) 100 MG tablet Take 100 mg by mouth at bedtime.    [provider]    Family History History reviewed. No pertinent family history.  Social History Social History   Tobacco Use  . Smoking status: Never Smoker  . Smokeless tobacco: Never Used  Substance Use Topics  . Alcohol use: No  . Drug use: No     Allergies   Ibuprofen   Review of Systems Review of Systems  Respiratory: Positive for cough.   All other systems reviewed and are negative.    Physical Exam Updated Vital Signs BP (!) 142/69 (BP Location: Left Arm)  Pulse 92   Temp 99 F (37.2 C) (Oral)   Resp 20   Ht 5\' 5"  (1.651 m)   Wt 83.9 kg   SpO2 97%   BMI 30.79 kg/m   Physical Exam Vitals signs and nursing note reviewed.  Constitutional:      Appearance: Normal appearance.  HENT:     Head: Normocephalic.     Nose: Nose normal.     Mouth/Throat:     Mouth: Mucous membranes are moist.  Eyes:     Pupils: Pupils are equal, round, and reactive to light.  Neck:     Musculoskeletal: Normal range of motion.  Cardiovascular:     Rate and Rhythm: Normal rate and regular rhythm.     Pulses: Normal pulses.     Heart sounds: Normal heart sounds.  Pulmonary:     Effort: Pulmonary effort is normal.     Comments: Crackles R base  Abdominal:     General: Abdomen is flat.     Palpations: Abdomen is soft.  Musculoskeletal: Normal range of motion.  Skin:    General: Skin is warm.     Capillary  Refill: Capillary refill takes less than 2 seconds.  Neurological:     General: No focal deficit present.     Mental Status: She is alert and oriented to person, place, and time.  Psychiatric:        Mood and Affect: Mood normal.        Behavior: Behavior normal.      ED Treatments / Results  Labs (all labs ordered are listed, but only abnormal results are displayed) Labs Reviewed  SARS CORONAVIRUS 2 (TAT 6-24 HRS)  CULTURE, BLOOD (ROUTINE X 2)  CULTURE, BLOOD (ROUTINE X 2)  CBC WITH DIFFERENTIAL/PLATELET  BASIC METABOLIC PANEL  LACTIC ACID, PLASMA  LACTIC ACID, PLASMA    EKG None  Radiology Dg Chest 2 View  Result Date: 08/26/2019 CLINICAL DATA:  Cough for 4 weeks.  Back pain. EXAM: CHEST - 2 VIEW COMPARISON:  07/12/2018 and 06/20/2016. FINDINGS: Cardiac silhouette is normal in size. No mediastinal or hilar masses. No evidence of adenopathy. There is patchy opacity at the right lung base, projecting primarily in the right lower lobe on the lateral view. This is new compared to the study dated 06/20/2016. When compared to the more recent exam, however, this is somewhat decreased from the opacity noted at the right lung base. The remainder of the lungs is clear. Lungs are hyperexpanded. No pleural effusion or pneumothorax. Skeletal structures are intact. IMPRESSION: 1. Patchy opacity in the right lower lobe. Although this is decreased when compared to the chest radiograph from 07/12/2018, this is favored to be a new pneumonia. 2. No other evidence of acute cardiopulmonary disease. Electronically Signed   By: Lajean Manes M.D.   On: 08/26/2019 17:54    Procedures Procedures (including critical care time)  Medications Ordered in ED Medications  levofloxacin (LEVAQUIN) IVPB 750 mg (750 mg Intravenous New Bag/Given 08/26/19 1908)     Initial Impression / Assessment and Plan / ED Course  I have reviewed the triage vital signs and the nursing notes.  Pertinent labs & imaging  results that were available during my care of the patient were reviewed by me and considered in my medical decision making (see chart for details).        Diane Golden is a 63 y.o. female here with cough, chills. Hx of pneumonia and concern for possible pneumonia vs COVID. Not  hypoxic. Well appearing. Will do sepsis workup with CBC, CMP, lactate, cultures, CXR, COVID.   8:22 PM WBC is 19. But her lactate is normal. Not hypoxic. CXR showed pneumonia. K 2.9, supplemented. She is prescribed potassium but haven't been taking it. Blood cultures sent and COVID pending. Told her to quarantine at home until COVID result comes back and will prescribe levaquin.   Diane Golden was evaluated in Emergency Department on 08/26/2019 for the symptoms described in the history of present illness. She was evaluated in the context of the global COVID-19 pandemic, which necessitated consideration that the patient might be at risk for infection with the SARS-CoV-2 virus that causes COVID-19. Institutional protocols and algorithms that pertain to the evaluation of patients at risk for COVID-19 are in a state of rapid change based on information released by regulatory bodies including the CDC and federal and state organizations. These policies and algorithms were followed during the patient's care in the ED.   Final Clinical Impressions(s) / ED Diagnoses   Final diagnoses:  None    ED Discharge Orders    None       Drenda Freeze, MD 08/26/19 2023

## 2019-08-27 LAB — SARS CORONAVIRUS 2 (TAT 6-24 HRS): SARS Coronavirus 2: NEGATIVE

## 2019-08-28 DIAGNOSIS — I739 Peripheral vascular disease, unspecified: Secondary | ICD-10-CM | POA: Diagnosis not present

## 2019-08-28 DIAGNOSIS — J440 Chronic obstructive pulmonary disease with acute lower respiratory infection: Secondary | ICD-10-CM | POA: Diagnosis not present

## 2019-08-28 DIAGNOSIS — I4819 Other persistent atrial fibrillation: Secondary | ICD-10-CM | POA: Diagnosis not present

## 2019-08-28 DIAGNOSIS — D6489 Other specified anemias: Secondary | ICD-10-CM | POA: Diagnosis not present

## 2019-08-28 DIAGNOSIS — I2510 Atherosclerotic heart disease of native coronary artery without angina pectoris: Secondary | ICD-10-CM | POA: Diagnosis not present

## 2019-08-28 DIAGNOSIS — D72829 Elevated white blood cell count, unspecified: Secondary | ICD-10-CM | POA: Diagnosis not present

## 2019-08-28 DIAGNOSIS — E876 Hypokalemia: Secondary | ICD-10-CM | POA: Diagnosis not present

## 2019-08-28 DIAGNOSIS — E669 Obesity, unspecified: Secondary | ICD-10-CM | POA: Diagnosis not present

## 2019-08-28 DIAGNOSIS — I160 Hypertensive urgency: Secondary | ICD-10-CM | POA: Diagnosis not present

## 2019-08-28 DIAGNOSIS — G8929 Other chronic pain: Secondary | ICD-10-CM | POA: Diagnosis not present

## 2019-08-28 DIAGNOSIS — J9811 Atelectasis: Secondary | ICD-10-CM | POA: Diagnosis not present

## 2019-08-28 DIAGNOSIS — B960 Mycoplasma pneumoniae [M. pneumoniae] as the cause of diseases classified elsewhere: Secondary | ICD-10-CM | POA: Diagnosis not present

## 2019-08-28 DIAGNOSIS — R6520 Severe sepsis without septic shock: Secondary | ICD-10-CM | POA: Diagnosis not present

## 2019-08-28 DIAGNOSIS — F329 Major depressive disorder, single episode, unspecified: Secondary | ICD-10-CM | POA: Diagnosis not present

## 2019-08-28 DIAGNOSIS — R7402 Elevation of levels of lactic acid dehydrogenase (LDH): Secondary | ICD-10-CM | POA: Diagnosis not present

## 2019-08-28 DIAGNOSIS — F410 Panic disorder [episodic paroxysmal anxiety] without agoraphobia: Secondary | ICD-10-CM | POA: Diagnosis not present

## 2019-08-28 DIAGNOSIS — Z7901 Long term (current) use of anticoagulants: Secondary | ICD-10-CM | POA: Diagnosis not present

## 2019-08-28 DIAGNOSIS — J449 Chronic obstructive pulmonary disease, unspecified: Secondary | ICD-10-CM | POA: Diagnosis not present

## 2019-08-28 DIAGNOSIS — A419 Sepsis, unspecified organism: Secondary | ICD-10-CM | POA: Diagnosis not present

## 2019-08-28 DIAGNOSIS — M810 Age-related osteoporosis without current pathological fracture: Secondary | ICD-10-CM | POA: Diagnosis not present

## 2019-08-28 DIAGNOSIS — E559 Vitamin D deficiency, unspecified: Secondary | ICD-10-CM | POA: Diagnosis not present

## 2019-08-28 DIAGNOSIS — R9431 Abnormal electrocardiogram [ECG] [EKG]: Secondary | ICD-10-CM | POA: Diagnosis not present

## 2019-08-28 DIAGNOSIS — L89151 Pressure ulcer of sacral region, stage 1: Secondary | ICD-10-CM | POA: Diagnosis not present

## 2019-08-28 DIAGNOSIS — I517 Cardiomegaly: Secondary | ICD-10-CM | POA: Diagnosis not present

## 2019-08-28 DIAGNOSIS — Z9119 Patient's noncompliance with other medical treatment and regimen: Secondary | ICD-10-CM | POA: Diagnosis not present

## 2019-08-28 DIAGNOSIS — R0602 Shortness of breath: Secondary | ICD-10-CM | POA: Diagnosis not present

## 2019-08-28 DIAGNOSIS — D649 Anemia, unspecified: Secondary | ICD-10-CM | POA: Diagnosis not present

## 2019-08-28 DIAGNOSIS — R05 Cough: Secondary | ICD-10-CM | POA: Diagnosis not present

## 2019-08-28 DIAGNOSIS — H8112 Benign paroxysmal vertigo, left ear: Secondary | ICD-10-CM | POA: Diagnosis not present

## 2019-08-28 DIAGNOSIS — Z20828 Contact with and (suspected) exposure to other viral communicable diseases: Secondary | ICD-10-CM | POA: Diagnosis not present

## 2019-08-28 DIAGNOSIS — I4891 Unspecified atrial fibrillation: Secondary | ICD-10-CM | POA: Diagnosis not present

## 2019-08-28 DIAGNOSIS — E785 Hyperlipidemia, unspecified: Secondary | ICD-10-CM | POA: Diagnosis not present

## 2019-08-28 DIAGNOSIS — I10 Essential (primary) hypertension: Secondary | ICD-10-CM | POA: Diagnosis not present

## 2019-08-28 DIAGNOSIS — F419 Anxiety disorder, unspecified: Secondary | ICD-10-CM | POA: Diagnosis not present

## 2019-08-28 DIAGNOSIS — D473 Essential (hemorrhagic) thrombocythemia: Secondary | ICD-10-CM | POA: Diagnosis not present

## 2019-08-28 DIAGNOSIS — G9009 Other idiopathic peripheral autonomic neuropathy: Secondary | ICD-10-CM | POA: Diagnosis not present

## 2019-08-28 DIAGNOSIS — J9602 Acute respiratory failure with hypercapnia: Secondary | ICD-10-CM | POA: Diagnosis not present

## 2019-08-28 DIAGNOSIS — G4700 Insomnia, unspecified: Secondary | ICD-10-CM | POA: Diagnosis not present

## 2019-08-28 DIAGNOSIS — R6510 Systemic inflammatory response syndrome (SIRS) of non-infectious origin without acute organ dysfunction: Secondary | ICD-10-CM | POA: Diagnosis not present

## 2019-08-28 DIAGNOSIS — J159 Unspecified bacterial pneumonia: Secondary | ICD-10-CM | POA: Diagnosis not present

## 2019-08-28 DIAGNOSIS — R197 Diarrhea, unspecified: Secondary | ICD-10-CM | POA: Diagnosis not present

## 2019-08-28 DIAGNOSIS — J42 Unspecified chronic bronchitis: Secondary | ICD-10-CM | POA: Diagnosis not present

## 2019-08-28 DIAGNOSIS — J9819 Other pulmonary collapse: Secondary | ICD-10-CM | POA: Diagnosis not present

## 2019-08-28 DIAGNOSIS — J9691 Respiratory failure, unspecified with hypoxia: Secondary | ICD-10-CM | POA: Diagnosis not present

## 2019-08-28 DIAGNOSIS — J9601 Acute respiratory failure with hypoxia: Secondary | ICD-10-CM | POA: Diagnosis not present

## 2019-08-28 DIAGNOSIS — R918 Other nonspecific abnormal finding of lung field: Secondary | ICD-10-CM | POA: Diagnosis not present

## 2019-08-28 DIAGNOSIS — J189 Pneumonia, unspecified organism: Secondary | ICD-10-CM | POA: Diagnosis not present

## 2019-08-28 DIAGNOSIS — J90 Pleural effusion, not elsewhere classified: Secondary | ICD-10-CM | POA: Diagnosis not present

## 2019-08-28 DIAGNOSIS — E872 Acidosis: Secondary | ICD-10-CM | POA: Diagnosis not present

## 2019-08-28 DIAGNOSIS — G4733 Obstructive sleep apnea (adult) (pediatric): Secondary | ICD-10-CM | POA: Diagnosis not present

## 2019-08-29 LAB — PATHOLOGIST SMEAR REVIEW: Path Review: 11232020

## 2019-09-01 LAB — CULTURE, BLOOD (ROUTINE X 2)
Culture: NO GROWTH
Culture: NO GROWTH
Special Requests: ADEQUATE
Special Requests: ADEQUATE

## 2019-09-07 ENCOUNTER — Encounter: Payer: Self-pay | Admitting: Diagnostic Neuroimaging

## 2019-09-08 DIAGNOSIS — D473 Essential (hemorrhagic) thrombocythemia: Secondary | ICD-10-CM | POA: Diagnosis not present

## 2019-09-08 DIAGNOSIS — E876 Hypokalemia: Secondary | ICD-10-CM | POA: Diagnosis not present

## 2019-09-08 DIAGNOSIS — E559 Vitamin D deficiency, unspecified: Secondary | ICD-10-CM | POA: Diagnosis not present

## 2019-09-08 DIAGNOSIS — J9601 Acute respiratory failure with hypoxia: Secondary | ICD-10-CM | POA: Diagnosis not present

## 2019-09-08 DIAGNOSIS — Z7901 Long term (current) use of anticoagulants: Secondary | ICD-10-CM | POA: Diagnosis not present

## 2019-09-08 DIAGNOSIS — J42 Unspecified chronic bronchitis: Secondary | ICD-10-CM | POA: Diagnosis not present

## 2019-09-08 DIAGNOSIS — M810 Age-related osteoporosis without current pathological fracture: Secondary | ICD-10-CM | POA: Diagnosis not present

## 2019-09-08 DIAGNOSIS — H8112 Benign paroxysmal vertigo, left ear: Secondary | ICD-10-CM | POA: Diagnosis not present

## 2019-09-08 DIAGNOSIS — F410 Panic disorder [episodic paroxysmal anxiety] without agoraphobia: Secondary | ICD-10-CM | POA: Diagnosis not present

## 2019-09-08 DIAGNOSIS — E785 Hyperlipidemia, unspecified: Secondary | ICD-10-CM | POA: Diagnosis not present

## 2019-09-08 DIAGNOSIS — J449 Chronic obstructive pulmonary disease, unspecified: Secondary | ICD-10-CM | POA: Diagnosis not present

## 2019-09-08 DIAGNOSIS — J9819 Other pulmonary collapse: Secondary | ICD-10-CM | POA: Diagnosis not present

## 2019-09-08 DIAGNOSIS — J90 Pleural effusion, not elsewhere classified: Secondary | ICD-10-CM | POA: Diagnosis not present

## 2019-09-08 DIAGNOSIS — G4700 Insomnia, unspecified: Secondary | ICD-10-CM | POA: Diagnosis not present

## 2019-09-08 DIAGNOSIS — F411 Generalized anxiety disorder: Secondary | ICD-10-CM | POA: Diagnosis not present

## 2019-09-08 DIAGNOSIS — I4891 Unspecified atrial fibrillation: Secondary | ICD-10-CM | POA: Diagnosis not present

## 2019-09-08 DIAGNOSIS — I959 Hypotension, unspecified: Secondary | ICD-10-CM | POA: Diagnosis not present

## 2019-09-08 DIAGNOSIS — J189 Pneumonia, unspecified organism: Secondary | ICD-10-CM | POA: Diagnosis not present

## 2019-09-08 DIAGNOSIS — R0989 Other specified symptoms and signs involving the circulatory and respiratory systems: Secondary | ICD-10-CM | POA: Diagnosis not present

## 2019-09-08 DIAGNOSIS — I739 Peripheral vascular disease, unspecified: Secondary | ICD-10-CM | POA: Diagnosis not present

## 2019-09-08 DIAGNOSIS — F329 Major depressive disorder, single episode, unspecified: Secondary | ICD-10-CM | POA: Diagnosis not present

## 2019-09-08 DIAGNOSIS — F419 Anxiety disorder, unspecified: Secondary | ICD-10-CM | POA: Diagnosis not present

## 2019-09-08 DIAGNOSIS — G8929 Other chronic pain: Secondary | ICD-10-CM | POA: Diagnosis not present

## 2019-09-08 DIAGNOSIS — J9621 Acute and chronic respiratory failure with hypoxia: Secondary | ICD-10-CM | POA: Diagnosis not present

## 2019-09-08 DIAGNOSIS — G4733 Obstructive sleep apnea (adult) (pediatric): Secondary | ICD-10-CM | POA: Diagnosis not present

## 2019-09-08 DIAGNOSIS — R5381 Other malaise: Secondary | ICD-10-CM | POA: Diagnosis not present

## 2019-09-08 DIAGNOSIS — G9009 Other idiopathic peripheral autonomic neuropathy: Secondary | ICD-10-CM | POA: Diagnosis not present

## 2019-09-08 DIAGNOSIS — D649 Anemia, unspecified: Secondary | ICD-10-CM | POA: Diagnosis not present

## 2019-09-08 DIAGNOSIS — A419 Sepsis, unspecified organism: Secondary | ICD-10-CM | POA: Diagnosis not present

## 2019-09-08 DIAGNOSIS — Z9889 Other specified postprocedural states: Secondary | ICD-10-CM | POA: Diagnosis not present

## 2019-09-08 DIAGNOSIS — R6510 Systemic inflammatory response syndrome (SIRS) of non-infectious origin without acute organ dysfunction: Secondary | ICD-10-CM | POA: Diagnosis not present

## 2019-09-08 DIAGNOSIS — J9811 Atelectasis: Secondary | ICD-10-CM | POA: Diagnosis not present

## 2019-09-08 DIAGNOSIS — I10 Essential (primary) hypertension: Secondary | ICD-10-CM | POA: Diagnosis not present

## 2019-09-08 DIAGNOSIS — E669 Obesity, unspecified: Secondary | ICD-10-CM | POA: Diagnosis not present

## 2019-09-11 DIAGNOSIS — D649 Anemia, unspecified: Secondary | ICD-10-CM | POA: Diagnosis not present

## 2019-09-11 DIAGNOSIS — I959 Hypotension, unspecified: Secondary | ICD-10-CM | POA: Diagnosis not present

## 2019-09-11 DIAGNOSIS — A419 Sepsis, unspecified organism: Secondary | ICD-10-CM | POA: Diagnosis not present

## 2019-09-11 DIAGNOSIS — J189 Pneumonia, unspecified organism: Secondary | ICD-10-CM | POA: Diagnosis not present

## 2019-09-11 DIAGNOSIS — Z9889 Other specified postprocedural states: Secondary | ICD-10-CM | POA: Diagnosis not present

## 2019-09-11 DIAGNOSIS — J449 Chronic obstructive pulmonary disease, unspecified: Secondary | ICD-10-CM | POA: Diagnosis not present

## 2019-09-11 DIAGNOSIS — J9621 Acute and chronic respiratory failure with hypoxia: Secondary | ICD-10-CM | POA: Diagnosis not present

## 2019-09-11 DIAGNOSIS — I4891 Unspecified atrial fibrillation: Secondary | ICD-10-CM | POA: Diagnosis not present

## 2019-09-11 DIAGNOSIS — J90 Pleural effusion, not elsewhere classified: Secondary | ICD-10-CM | POA: Diagnosis not present

## 2019-09-11 DIAGNOSIS — R5381 Other malaise: Secondary | ICD-10-CM | POA: Diagnosis not present

## 2019-09-11 DIAGNOSIS — J9811 Atelectasis: Secondary | ICD-10-CM | POA: Diagnosis not present

## 2019-09-11 DIAGNOSIS — I10 Essential (primary) hypertension: Secondary | ICD-10-CM | POA: Diagnosis not present

## 2019-09-12 DIAGNOSIS — D649 Anemia, unspecified: Secondary | ICD-10-CM | POA: Diagnosis not present

## 2019-09-12 DIAGNOSIS — G8929 Other chronic pain: Secondary | ICD-10-CM | POA: Diagnosis not present

## 2019-09-12 DIAGNOSIS — I10 Essential (primary) hypertension: Secondary | ICD-10-CM | POA: Diagnosis not present

## 2019-09-12 DIAGNOSIS — J449 Chronic obstructive pulmonary disease, unspecified: Secondary | ICD-10-CM | POA: Diagnosis not present

## 2019-09-12 DIAGNOSIS — J189 Pneumonia, unspecified organism: Secondary | ICD-10-CM | POA: Diagnosis not present

## 2019-09-13 DIAGNOSIS — D649 Anemia, unspecified: Secondary | ICD-10-CM | POA: Diagnosis not present

## 2019-09-13 DIAGNOSIS — J9621 Acute and chronic respiratory failure with hypoxia: Secondary | ICD-10-CM | POA: Diagnosis not present

## 2019-09-13 DIAGNOSIS — R5381 Other malaise: Secondary | ICD-10-CM | POA: Diagnosis not present

## 2019-09-13 DIAGNOSIS — A419 Sepsis, unspecified organism: Secondary | ICD-10-CM | POA: Diagnosis not present

## 2019-09-13 DIAGNOSIS — Z9889 Other specified postprocedural states: Secondary | ICD-10-CM | POA: Diagnosis not present

## 2019-09-13 DIAGNOSIS — J9811 Atelectasis: Secondary | ICD-10-CM | POA: Diagnosis not present

## 2019-09-13 DIAGNOSIS — F411 Generalized anxiety disorder: Secondary | ICD-10-CM | POA: Diagnosis not present

## 2019-09-13 DIAGNOSIS — J189 Pneumonia, unspecified organism: Secondary | ICD-10-CM | POA: Diagnosis not present

## 2019-09-13 DIAGNOSIS — I10 Essential (primary) hypertension: Secondary | ICD-10-CM | POA: Diagnosis not present

## 2019-09-13 DIAGNOSIS — I4891 Unspecified atrial fibrillation: Secondary | ICD-10-CM | POA: Diagnosis not present

## 2019-09-13 DIAGNOSIS — G8929 Other chronic pain: Secondary | ICD-10-CM | POA: Diagnosis not present

## 2019-09-14 DIAGNOSIS — Z9889 Other specified postprocedural states: Secondary | ICD-10-CM | POA: Diagnosis not present

## 2019-09-14 DIAGNOSIS — I4891 Unspecified atrial fibrillation: Secondary | ICD-10-CM | POA: Diagnosis not present

## 2019-09-14 DIAGNOSIS — J449 Chronic obstructive pulmonary disease, unspecified: Secondary | ICD-10-CM | POA: Diagnosis not present

## 2019-09-14 DIAGNOSIS — J90 Pleural effusion, not elsewhere classified: Secondary | ICD-10-CM | POA: Diagnosis not present

## 2019-09-14 DIAGNOSIS — J9811 Atelectasis: Secondary | ICD-10-CM | POA: Diagnosis not present

## 2019-09-14 DIAGNOSIS — D649 Anemia, unspecified: Secondary | ICD-10-CM | POA: Diagnosis not present

## 2019-09-14 DIAGNOSIS — J189 Pneumonia, unspecified organism: Secondary | ICD-10-CM | POA: Diagnosis not present

## 2019-10-03 DIAGNOSIS — G4700 Insomnia, unspecified: Secondary | ICD-10-CM | POA: Diagnosis not present

## 2019-10-03 DIAGNOSIS — F411 Generalized anxiety disorder: Secondary | ICD-10-CM | POA: Diagnosis not present

## 2019-10-03 DIAGNOSIS — F331 Major depressive disorder, recurrent, moderate: Secondary | ICD-10-CM | POA: Diagnosis not present

## 2019-10-03 DIAGNOSIS — F410 Panic disorder [episodic paroxysmal anxiety] without agoraphobia: Secondary | ICD-10-CM | POA: Diagnosis not present

## 2019-10-04 DIAGNOSIS — M4807 Spinal stenosis, lumbosacral region: Secondary | ICD-10-CM | POA: Diagnosis not present

## 2019-10-04 DIAGNOSIS — I739 Peripheral vascular disease, unspecified: Secondary | ICD-10-CM | POA: Diagnosis not present

## 2019-10-04 DIAGNOSIS — M25551 Pain in right hip: Secondary | ICD-10-CM | POA: Diagnosis not present

## 2019-10-04 DIAGNOSIS — M4316 Spondylolisthesis, lumbar region: Secondary | ICD-10-CM | POA: Diagnosis not present

## 2019-10-04 DIAGNOSIS — M5416 Radiculopathy, lumbar region: Secondary | ICD-10-CM | POA: Diagnosis not present

## 2019-10-04 DIAGNOSIS — M5136 Other intervertebral disc degeneration, lumbar region: Secondary | ICD-10-CM | POA: Diagnosis not present

## 2019-10-04 DIAGNOSIS — G609 Hereditary and idiopathic neuropathy, unspecified: Secondary | ICD-10-CM | POA: Diagnosis not present

## 2019-10-12 DIAGNOSIS — E782 Mixed hyperlipidemia: Secondary | ICD-10-CM | POA: Diagnosis not present

## 2019-10-12 DIAGNOSIS — Z7901 Long term (current) use of anticoagulants: Secondary | ICD-10-CM | POA: Diagnosis not present

## 2019-10-12 DIAGNOSIS — I10 Essential (primary) hypertension: Secondary | ICD-10-CM | POA: Diagnosis not present

## 2019-10-12 DIAGNOSIS — I480 Paroxysmal atrial fibrillation: Secondary | ICD-10-CM | POA: Diagnosis not present

## 2019-10-12 DIAGNOSIS — D649 Anemia, unspecified: Secondary | ICD-10-CM | POA: Diagnosis not present

## 2019-10-13 DIAGNOSIS — I10 Essential (primary) hypertension: Secondary | ICD-10-CM | POA: Diagnosis not present

## 2019-10-13 DIAGNOSIS — F411 Generalized anxiety disorder: Secondary | ICD-10-CM | POA: Diagnosis not present

## 2019-10-13 DIAGNOSIS — Z79899 Other long term (current) drug therapy: Secondary | ICD-10-CM | POA: Diagnosis not present

## 2019-10-13 DIAGNOSIS — F410 Panic disorder [episodic paroxysmal anxiety] without agoraphobia: Secondary | ICD-10-CM | POA: Diagnosis not present

## 2019-10-13 DIAGNOSIS — R9431 Abnormal electrocardiogram [ECG] [EKG]: Secondary | ICD-10-CM | POA: Diagnosis not present

## 2019-10-13 DIAGNOSIS — G4700 Insomnia, unspecified: Secondary | ICD-10-CM | POA: Diagnosis not present

## 2019-10-18 DIAGNOSIS — M48061 Spinal stenosis, lumbar region without neurogenic claudication: Secondary | ICD-10-CM | POA: Diagnosis not present

## 2019-10-18 DIAGNOSIS — M5126 Other intervertebral disc displacement, lumbar region: Secondary | ICD-10-CM | POA: Diagnosis not present

## 2019-10-18 DIAGNOSIS — M25551 Pain in right hip: Secondary | ICD-10-CM | POA: Diagnosis not present

## 2019-10-18 DIAGNOSIS — M5136 Other intervertebral disc degeneration, lumbar region: Secondary | ICD-10-CM | POA: Diagnosis not present

## 2019-10-31 DIAGNOSIS — F331 Major depressive disorder, recurrent, moderate: Secondary | ICD-10-CM | POA: Diagnosis not present

## 2019-10-31 DIAGNOSIS — F411 Generalized anxiety disorder: Secondary | ICD-10-CM | POA: Diagnosis not present

## 2019-10-31 DIAGNOSIS — Z780 Asymptomatic menopausal state: Secondary | ICD-10-CM | POA: Diagnosis not present

## 2019-10-31 DIAGNOSIS — G4700 Insomnia, unspecified: Secondary | ICD-10-CM | POA: Diagnosis not present

## 2019-10-31 DIAGNOSIS — F410 Panic disorder [episodic paroxysmal anxiety] without agoraphobia: Secondary | ICD-10-CM | POA: Diagnosis not present

## 2019-11-08 DIAGNOSIS — I739 Peripheral vascular disease, unspecified: Secondary | ICD-10-CM | POA: Diagnosis not present

## 2019-11-08 DIAGNOSIS — M5136 Other intervertebral disc degeneration, lumbar region: Secondary | ICD-10-CM | POA: Diagnosis not present

## 2019-11-08 DIAGNOSIS — M84350D Stress fracture, pelvis, subsequent encounter for fracture with routine healing: Secondary | ICD-10-CM | POA: Diagnosis not present

## 2019-11-08 DIAGNOSIS — G609 Hereditary and idiopathic neuropathy, unspecified: Secondary | ICD-10-CM | POA: Diagnosis not present

## 2019-11-13 DIAGNOSIS — Z1159 Encounter for screening for other viral diseases: Secondary | ICD-10-CM | POA: Diagnosis not present

## 2019-11-13 DIAGNOSIS — R5383 Other fatigue: Secondary | ICD-10-CM | POA: Diagnosis not present

## 2019-11-13 DIAGNOSIS — D539 Nutritional anemia, unspecified: Secondary | ICD-10-CM | POA: Diagnosis not present

## 2019-11-13 DIAGNOSIS — Z79899 Other long term (current) drug therapy: Secondary | ICD-10-CM | POA: Diagnosis not present

## 2019-11-13 DIAGNOSIS — E559 Vitamin D deficiency, unspecified: Secondary | ICD-10-CM | POA: Diagnosis not present

## 2019-11-13 DIAGNOSIS — R300 Dysuria: Secondary | ICD-10-CM | POA: Diagnosis not present

## 2019-11-13 DIAGNOSIS — M129 Arthropathy, unspecified: Secondary | ICD-10-CM | POA: Diagnosis not present

## 2019-11-13 DIAGNOSIS — E7800 Pure hypercholesterolemia, unspecified: Secondary | ICD-10-CM | POA: Diagnosis not present

## 2019-11-13 DIAGNOSIS — Z0000 Encounter for general adult medical examination without abnormal findings: Secondary | ICD-10-CM | POA: Diagnosis not present

## 2019-11-21 DIAGNOSIS — M5136 Other intervertebral disc degeneration, lumbar region: Secondary | ICD-10-CM | POA: Diagnosis not present

## 2019-11-21 DIAGNOSIS — S3210XA Unspecified fracture of sacrum, initial encounter for closed fracture: Secondary | ICD-10-CM | POA: Diagnosis not present

## 2019-11-21 DIAGNOSIS — M84350D Stress fracture, pelvis, subsequent encounter for fracture with routine healing: Secondary | ICD-10-CM | POA: Diagnosis not present

## 2019-11-21 DIAGNOSIS — M25551 Pain in right hip: Secondary | ICD-10-CM | POA: Diagnosis not present

## 2019-11-24 DIAGNOSIS — M94251 Chondromalacia, right hip: Secondary | ICD-10-CM | POA: Diagnosis not present

## 2019-11-24 DIAGNOSIS — M5136 Other intervertebral disc degeneration, lumbar region: Secondary | ICD-10-CM | POA: Diagnosis not present

## 2019-12-01 DIAGNOSIS — M25551 Pain in right hip: Secondary | ICD-10-CM | POA: Diagnosis not present

## 2019-12-01 DIAGNOSIS — M5136 Other intervertebral disc degeneration, lumbar region: Secondary | ICD-10-CM | POA: Diagnosis not present

## 2019-12-12 DIAGNOSIS — M5440 Lumbago with sciatica, unspecified side: Secondary | ICD-10-CM | POA: Diagnosis not present

## 2019-12-12 DIAGNOSIS — M797 Fibromyalgia: Secondary | ICD-10-CM | POA: Diagnosis not present

## 2019-12-12 DIAGNOSIS — Z79899 Other long term (current) drug therapy: Secondary | ICD-10-CM | POA: Diagnosis not present

## 2019-12-12 DIAGNOSIS — M5136 Other intervertebral disc degeneration, lumbar region: Secondary | ICD-10-CM | POA: Diagnosis not present

## 2019-12-14 DIAGNOSIS — M25551 Pain in right hip: Secondary | ICD-10-CM | POA: Diagnosis not present

## 2019-12-14 DIAGNOSIS — M1611 Unilateral primary osteoarthritis, right hip: Secondary | ICD-10-CM | POA: Diagnosis not present

## 2019-12-18 DIAGNOSIS — F064 Anxiety disorder due to known physiological condition: Secondary | ICD-10-CM | POA: Diagnosis not present

## 2019-12-18 DIAGNOSIS — F331 Major depressive disorder, recurrent, moderate: Secondary | ICD-10-CM | POA: Diagnosis not present

## 2019-12-18 DIAGNOSIS — Z131 Encounter for screening for diabetes mellitus: Secondary | ICD-10-CM | POA: Diagnosis not present

## 2019-12-18 DIAGNOSIS — G4700 Insomnia, unspecified: Secondary | ICD-10-CM | POA: Diagnosis not present

## 2019-12-18 IMAGING — CR DG CHEST 2V
2 series · 2 of 2 positions shown · non-contrast
Comparison: 07/12/2018 and 06/20/2016.

CLINICAL DATA: Cough for 4 weeks.  Back pain.

EXAM:
CHEST - 2 VIEW

[w chest pa]
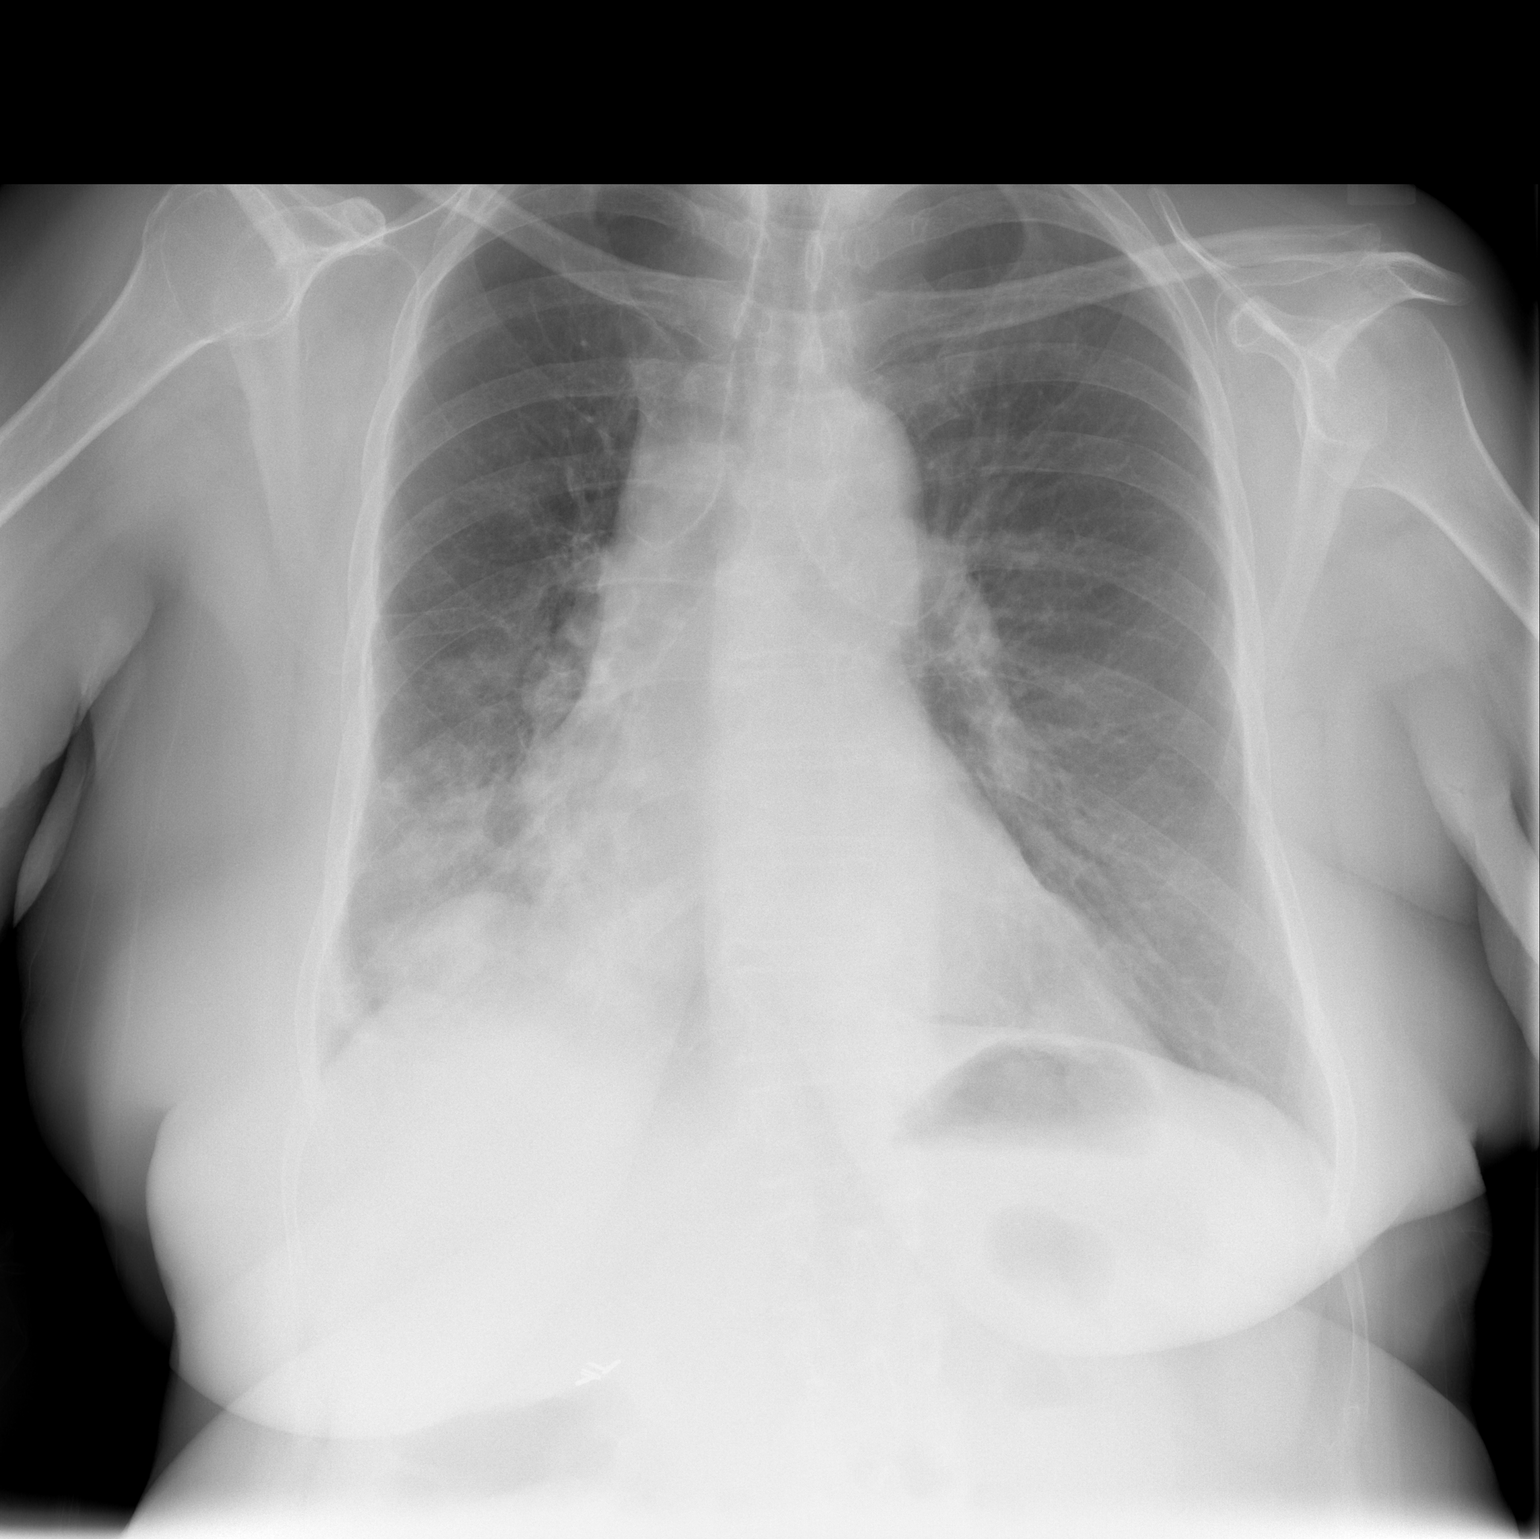

[w chest lat]
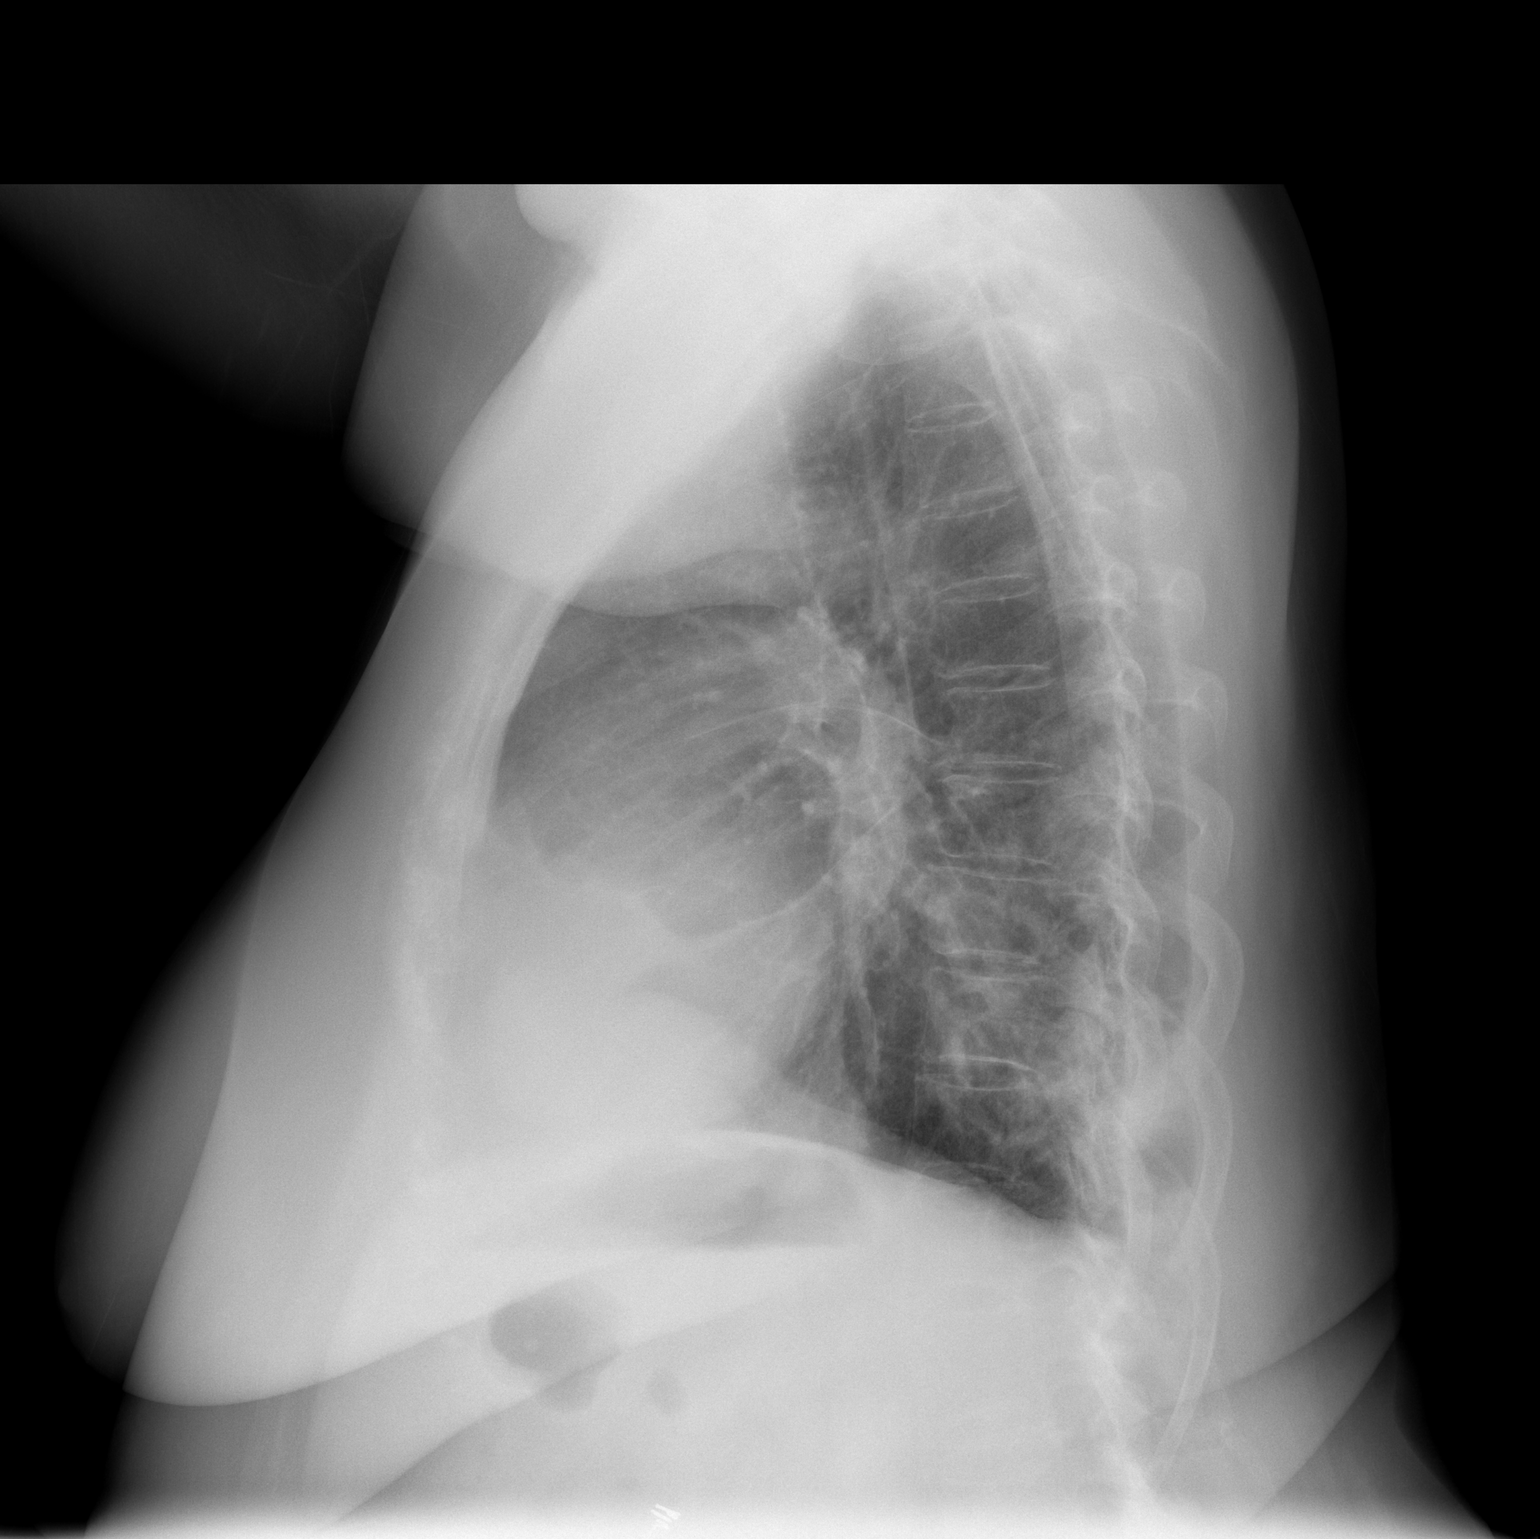

[2 of 2 positions shown; findings below may reference images not displayed]

FINDINGS: Cardiac silhouette is normal in size. No mediastinal or hilar
masses. No evidence of adenopathy.

There is patchy opacity at the right lung base, projecting primarily
in the right lower lobe on the lateral view. This is new compared to
the study dated 06/20/2016. When compared to the more recent exam,
however, this is somewhat decreased from the opacity noted at the
right lung base. The remainder of the lungs is clear. Lungs are
hyperexpanded.

No pleural effusion or pneumothorax.

Skeletal structures are intact.
IMPRESSION: 1. Patchy opacity in the right lower lobe. Although this is
decreased when compared to the chest radiograph from 07/12/2018,
this is favored to be a new pneumonia.
2. No other evidence of acute cardiopulmonary disease.

## 2020-01-10 DIAGNOSIS — Z79899 Other long term (current) drug therapy: Secondary | ICD-10-CM | POA: Diagnosis not present

## 2020-01-10 DIAGNOSIS — G8929 Other chronic pain: Secondary | ICD-10-CM | POA: Diagnosis not present

## 2020-01-10 DIAGNOSIS — M25562 Pain in left knee: Secondary | ICD-10-CM | POA: Diagnosis not present

## 2020-01-10 DIAGNOSIS — M25559 Pain in unspecified hip: Secondary | ICD-10-CM | POA: Diagnosis not present

## 2020-01-10 DIAGNOSIS — M25561 Pain in right knee: Secondary | ICD-10-CM | POA: Diagnosis not present

## 2020-01-11 DIAGNOSIS — M47816 Spondylosis without myelopathy or radiculopathy, lumbar region: Secondary | ICD-10-CM | POA: Diagnosis not present

## 2020-01-11 DIAGNOSIS — M533 Sacrococcygeal disorders, not elsewhere classified: Secondary | ICD-10-CM | POA: Diagnosis not present

## 2020-01-11 DIAGNOSIS — M25551 Pain in right hip: Secondary | ICD-10-CM | POA: Diagnosis not present

## 2020-02-06 DIAGNOSIS — M25551 Pain in right hip: Secondary | ICD-10-CM | POA: Diagnosis not present

## 2020-02-06 DIAGNOSIS — M533 Sacrococcygeal disorders, not elsewhere classified: Secondary | ICD-10-CM | POA: Diagnosis not present

## 2020-02-09 DIAGNOSIS — D539 Nutritional anemia, unspecified: Secondary | ICD-10-CM | POA: Diagnosis not present

## 2020-02-09 DIAGNOSIS — E7800 Pure hypercholesterolemia, unspecified: Secondary | ICD-10-CM | POA: Diagnosis not present

## 2020-02-09 DIAGNOSIS — Z1159 Encounter for screening for other viral diseases: Secondary | ICD-10-CM | POA: Diagnosis not present

## 2020-02-09 DIAGNOSIS — M545 Low back pain: Secondary | ICD-10-CM | POA: Diagnosis not present

## 2020-02-09 DIAGNOSIS — E559 Vitamin D deficiency, unspecified: Secondary | ICD-10-CM | POA: Diagnosis not present

## 2020-02-09 DIAGNOSIS — R5383 Other fatigue: Secondary | ICD-10-CM | POA: Diagnosis not present

## 2020-02-09 DIAGNOSIS — J189 Pneumonia, unspecified organism: Secondary | ICD-10-CM | POA: Diagnosis not present

## 2020-02-09 DIAGNOSIS — E1165 Type 2 diabetes mellitus with hyperglycemia: Secondary | ICD-10-CM | POA: Diagnosis not present

## 2020-02-09 DIAGNOSIS — R300 Dysuria: Secondary | ICD-10-CM | POA: Diagnosis not present

## 2020-02-09 DIAGNOSIS — Z79899 Other long term (current) drug therapy: Secondary | ICD-10-CM | POA: Diagnosis not present

## 2020-02-09 DIAGNOSIS — M129 Arthropathy, unspecified: Secondary | ICD-10-CM | POA: Diagnosis not present

## 2020-02-26 DIAGNOSIS — R911 Solitary pulmonary nodule: Secondary | ICD-10-CM | POA: Diagnosis not present

## 2020-03-11 DIAGNOSIS — M5136 Other intervertebral disc degeneration, lumbar region: Secondary | ICD-10-CM | POA: Diagnosis not present

## 2020-03-11 DIAGNOSIS — R5383 Other fatigue: Secondary | ICD-10-CM | POA: Diagnosis not present

## 2020-03-11 DIAGNOSIS — M25569 Pain in unspecified knee: Secondary | ICD-10-CM | POA: Diagnosis not present

## 2020-03-11 DIAGNOSIS — Z79899 Other long term (current) drug therapy: Secondary | ICD-10-CM | POA: Diagnosis not present

## 2020-03-11 DIAGNOSIS — M545 Low back pain: Secondary | ICD-10-CM | POA: Diagnosis not present

## 2020-03-12 DIAGNOSIS — F064 Anxiety disorder due to known physiological condition: Secondary | ICD-10-CM | POA: Diagnosis not present

## 2020-03-12 DIAGNOSIS — F1320 Sedative, hypnotic or anxiolytic dependence, uncomplicated: Secondary | ICD-10-CM | POA: Diagnosis not present

## 2020-03-12 DIAGNOSIS — F331 Major depressive disorder, recurrent, moderate: Secondary | ICD-10-CM | POA: Diagnosis not present

## 2020-03-12 DIAGNOSIS — G4700 Insomnia, unspecified: Secondary | ICD-10-CM | POA: Diagnosis not present

## 2020-03-19 DIAGNOSIS — M25551 Pain in right hip: Secondary | ICD-10-CM | POA: Diagnosis not present

## 2020-03-19 DIAGNOSIS — M533 Sacrococcygeal disorders, not elsewhere classified: Secondary | ICD-10-CM | POA: Diagnosis not present

## 2020-04-10 DIAGNOSIS — M25551 Pain in right hip: Secondary | ICD-10-CM | POA: Diagnosis not present

## 2020-04-10 DIAGNOSIS — M25562 Pain in left knee: Secondary | ICD-10-CM | POA: Diagnosis not present

## 2020-04-10 DIAGNOSIS — Z79899 Other long term (current) drug therapy: Secondary | ICD-10-CM | POA: Diagnosis not present

## 2020-04-10 DIAGNOSIS — M25561 Pain in right knee: Secondary | ICD-10-CM | POA: Diagnosis not present

## 2020-05-07 DIAGNOSIS — F064 Anxiety disorder due to known physiological condition: Secondary | ICD-10-CM | POA: Diagnosis not present

## 2020-05-07 DIAGNOSIS — F331 Major depressive disorder, recurrent, moderate: Secondary | ICD-10-CM | POA: Diagnosis not present

## 2020-05-07 DIAGNOSIS — F1320 Sedative, hypnotic or anxiolytic dependence, uncomplicated: Secondary | ICD-10-CM | POA: Diagnosis not present

## 2020-05-07 DIAGNOSIS — G4700 Insomnia, unspecified: Secondary | ICD-10-CM | POA: Diagnosis not present

## 2020-05-08 DIAGNOSIS — M5440 Lumbago with sciatica, unspecified side: Secondary | ICD-10-CM | POA: Diagnosis not present

## 2020-05-08 DIAGNOSIS — R5383 Other fatigue: Secondary | ICD-10-CM | POA: Diagnosis not present

## 2020-05-08 DIAGNOSIS — Z20822 Contact with and (suspected) exposure to covid-19: Secondary | ICD-10-CM | POA: Diagnosis not present

## 2020-05-08 DIAGNOSIS — R300 Dysuria: Secondary | ICD-10-CM | POA: Diagnosis not present

## 2020-05-08 DIAGNOSIS — Z79899 Other long term (current) drug therapy: Secondary | ICD-10-CM | POA: Diagnosis not present

## 2020-05-08 DIAGNOSIS — D539 Nutritional anemia, unspecified: Secondary | ICD-10-CM | POA: Diagnosis not present

## 2020-05-08 DIAGNOSIS — E559 Vitamin D deficiency, unspecified: Secondary | ICD-10-CM | POA: Diagnosis not present

## 2020-05-08 DIAGNOSIS — E7800 Pure hypercholesterolemia, unspecified: Secondary | ICD-10-CM | POA: Diagnosis not present

## 2020-05-08 DIAGNOSIS — M129 Arthropathy, unspecified: Secondary | ICD-10-CM | POA: Diagnosis not present

## 2020-05-08 DIAGNOSIS — M5136 Other intervertebral disc degeneration, lumbar region: Secondary | ICD-10-CM | POA: Diagnosis not present

## 2020-05-17 DIAGNOSIS — H5210 Myopia, unspecified eye: Secondary | ICD-10-CM | POA: Diagnosis not present

## 2020-05-17 DIAGNOSIS — H2501 Cortical age-related cataract: Secondary | ICD-10-CM | POA: Diagnosis not present

## 2020-05-17 DIAGNOSIS — H25019 Cortical age-related cataract, unspecified eye: Secondary | ICD-10-CM | POA: Diagnosis not present

## 2020-06-07 DIAGNOSIS — Z79899 Other long term (current) drug therapy: Secondary | ICD-10-CM | POA: Diagnosis not present

## 2020-06-07 DIAGNOSIS — M797 Fibromyalgia: Secondary | ICD-10-CM | POA: Diagnosis not present

## 2020-06-07 DIAGNOSIS — M5440 Lumbago with sciatica, unspecified side: Secondary | ICD-10-CM | POA: Diagnosis not present

## 2020-06-07 DIAGNOSIS — E669 Obesity, unspecified: Secondary | ICD-10-CM | POA: Diagnosis not present

## 2020-06-07 DIAGNOSIS — M5136 Other intervertebral disc degeneration, lumbar region: Secondary | ICD-10-CM | POA: Diagnosis not present

## 2020-06-18 DIAGNOSIS — R079 Chest pain, unspecified: Secondary | ICD-10-CM | POA: Diagnosis not present

## 2020-06-18 DIAGNOSIS — Z5321 Procedure and treatment not carried out due to patient leaving prior to being seen by health care provider: Secondary | ICD-10-CM | POA: Diagnosis not present

## 2020-06-18 DIAGNOSIS — I482 Chronic atrial fibrillation, unspecified: Secondary | ICD-10-CM | POA: Diagnosis not present

## 2020-06-18 DIAGNOSIS — I4891 Unspecified atrial fibrillation: Secondary | ICD-10-CM | POA: Diagnosis not present

## 2020-06-18 DIAGNOSIS — R9431 Abnormal electrocardiogram [ECG] [EKG]: Secondary | ICD-10-CM | POA: Diagnosis not present

## 2020-06-19 DIAGNOSIS — H2513 Age-related nuclear cataract, bilateral: Secondary | ICD-10-CM | POA: Diagnosis not present

## 2020-06-19 DIAGNOSIS — I10 Essential (primary) hypertension: Secondary | ICD-10-CM | POA: Diagnosis not present

## 2020-06-19 DIAGNOSIS — Z9889 Other specified postprocedural states: Secondary | ICD-10-CM | POA: Diagnosis not present

## 2020-06-19 DIAGNOSIS — H25013 Cortical age-related cataract, bilateral: Secondary | ICD-10-CM | POA: Diagnosis not present

## 2020-07-08 DIAGNOSIS — M25561 Pain in right knee: Secondary | ICD-10-CM | POA: Diagnosis not present

## 2020-07-08 DIAGNOSIS — Z79899 Other long term (current) drug therapy: Secondary | ICD-10-CM | POA: Diagnosis not present

## 2020-07-08 DIAGNOSIS — M5440 Lumbago with sciatica, unspecified side: Secondary | ICD-10-CM | POA: Diagnosis not present

## 2020-07-08 DIAGNOSIS — Z23 Encounter for immunization: Secondary | ICD-10-CM | POA: Diagnosis not present

## 2020-07-08 DIAGNOSIS — M25562 Pain in left knee: Secondary | ICD-10-CM | POA: Diagnosis not present

## 2020-07-08 DIAGNOSIS — M5136 Other intervertebral disc degeneration, lumbar region: Secondary | ICD-10-CM | POA: Diagnosis not present

## 2020-07-16 DIAGNOSIS — G4700 Insomnia, unspecified: Secondary | ICD-10-CM | POA: Diagnosis not present

## 2020-07-16 DIAGNOSIS — F1320 Sedative, hypnotic or anxiolytic dependence, uncomplicated: Secondary | ICD-10-CM | POA: Diagnosis not present

## 2020-07-16 DIAGNOSIS — F064 Anxiety disorder due to known physiological condition: Secondary | ICD-10-CM | POA: Diagnosis not present

## 2020-07-16 DIAGNOSIS — F331 Major depressive disorder, recurrent, moderate: Secondary | ICD-10-CM | POA: Diagnosis not present

## 2020-07-23 DIAGNOSIS — H2511 Age-related nuclear cataract, right eye: Secondary | ICD-10-CM | POA: Diagnosis not present

## 2020-07-23 DIAGNOSIS — H2512 Age-related nuclear cataract, left eye: Secondary | ICD-10-CM | POA: Diagnosis not present

## 2020-07-23 DIAGNOSIS — H25012 Cortical age-related cataract, left eye: Secondary | ICD-10-CM | POA: Diagnosis not present

## 2020-07-30 DIAGNOSIS — H2512 Age-related nuclear cataract, left eye: Secondary | ICD-10-CM | POA: Diagnosis not present

## 2020-08-07 DIAGNOSIS — H269 Unspecified cataract: Secondary | ICD-10-CM | POA: Diagnosis not present

## 2020-08-08 DIAGNOSIS — Z79899 Other long term (current) drug therapy: Secondary | ICD-10-CM | POA: Diagnosis not present

## 2020-08-08 DIAGNOSIS — Z1159 Encounter for screening for other viral diseases: Secondary | ICD-10-CM | POA: Diagnosis not present

## 2020-08-08 DIAGNOSIS — Z20822 Contact with and (suspected) exposure to covid-19: Secondary | ICD-10-CM | POA: Diagnosis not present

## 2020-08-08 DIAGNOSIS — I10 Essential (primary) hypertension: Secondary | ICD-10-CM | POA: Diagnosis not present

## 2020-08-08 DIAGNOSIS — M129 Arthropathy, unspecified: Secondary | ICD-10-CM | POA: Diagnosis not present

## 2020-08-08 DIAGNOSIS — E7800 Pure hypercholesterolemia, unspecified: Secondary | ICD-10-CM | POA: Diagnosis not present

## 2020-08-08 DIAGNOSIS — E559 Vitamin D deficiency, unspecified: Secondary | ICD-10-CM | POA: Diagnosis not present

## 2020-08-08 DIAGNOSIS — R5383 Other fatigue: Secondary | ICD-10-CM | POA: Diagnosis not present

## 2020-08-08 DIAGNOSIS — R300 Dysuria: Secondary | ICD-10-CM | POA: Diagnosis not present

## 2020-08-08 DIAGNOSIS — D539 Nutritional anemia, unspecified: Secondary | ICD-10-CM | POA: Diagnosis not present

## 2020-08-27 DIAGNOSIS — F1320 Sedative, hypnotic or anxiolytic dependence, uncomplicated: Secondary | ICD-10-CM | POA: Diagnosis not present

## 2020-08-27 DIAGNOSIS — F349 Persistent mood [affective] disorder, unspecified: Secondary | ICD-10-CM | POA: Diagnosis not present

## 2020-08-27 DIAGNOSIS — Z1339 Encounter for screening examination for other mental health and behavioral disorders: Secondary | ICD-10-CM | POA: Diagnosis not present

## 2020-08-27 DIAGNOSIS — G4700 Insomnia, unspecified: Secondary | ICD-10-CM | POA: Diagnosis not present

## 2020-08-27 DIAGNOSIS — F064 Anxiety disorder due to known physiological condition: Secondary | ICD-10-CM | POA: Diagnosis not present

## 2020-09-06 DIAGNOSIS — G629 Polyneuropathy, unspecified: Secondary | ICD-10-CM | POA: Diagnosis not present

## 2020-09-06 DIAGNOSIS — M5440 Lumbago with sciatica, unspecified side: Secondary | ICD-10-CM | POA: Diagnosis not present

## 2020-09-06 DIAGNOSIS — M5136 Other intervertebral disc degeneration, lumbar region: Secondary | ICD-10-CM | POA: Diagnosis not present

## 2020-09-06 DIAGNOSIS — Z79899 Other long term (current) drug therapy: Secondary | ICD-10-CM | POA: Diagnosis not present

## 2020-09-16 DIAGNOSIS — M533 Sacrococcygeal disorders, not elsewhere classified: Secondary | ICD-10-CM | POA: Diagnosis not present

## 2020-09-16 DIAGNOSIS — M5416 Radiculopathy, lumbar region: Secondary | ICD-10-CM | POA: Diagnosis not present

## 2020-09-17 DIAGNOSIS — M5440 Lumbago with sciatica, unspecified side: Secondary | ICD-10-CM | POA: Diagnosis not present

## 2020-09-17 DIAGNOSIS — F419 Anxiety disorder, unspecified: Secondary | ICD-10-CM | POA: Diagnosis not present

## 2020-09-17 DIAGNOSIS — Z79899 Other long term (current) drug therapy: Secondary | ICD-10-CM | POA: Diagnosis not present

## 2020-09-17 DIAGNOSIS — G629 Polyneuropathy, unspecified: Secondary | ICD-10-CM | POA: Diagnosis not present

## 2020-09-17 DIAGNOSIS — M5136 Other intervertebral disc degeneration, lumbar region: Secondary | ICD-10-CM | POA: Diagnosis not present

## 2020-10-11 DIAGNOSIS — M533 Sacrococcygeal disorders, not elsewhere classified: Secondary | ICD-10-CM | POA: Diagnosis not present

## 2020-10-14 DIAGNOSIS — F064 Anxiety disorder due to known physiological condition: Secondary | ICD-10-CM | POA: Diagnosis not present

## 2020-10-14 DIAGNOSIS — F1320 Sedative, hypnotic or anxiolytic dependence, uncomplicated: Secondary | ICD-10-CM | POA: Diagnosis not present

## 2020-10-14 DIAGNOSIS — G4700 Insomnia, unspecified: Secondary | ICD-10-CM | POA: Diagnosis not present

## 2020-10-14 DIAGNOSIS — F349 Persistent mood [affective] disorder, unspecified: Secondary | ICD-10-CM | POA: Diagnosis not present

## 2020-11-01 DIAGNOSIS — Z79899 Other long term (current) drug therapy: Secondary | ICD-10-CM | POA: Diagnosis not present

## 2020-11-01 DIAGNOSIS — M5136 Other intervertebral disc degeneration, lumbar region: Secondary | ICD-10-CM | POA: Diagnosis not present

## 2020-11-01 DIAGNOSIS — M5440 Lumbago with sciatica, unspecified side: Secondary | ICD-10-CM | POA: Diagnosis not present

## 2020-11-01 DIAGNOSIS — R059 Cough, unspecified: Secondary | ICD-10-CM | POA: Diagnosis not present

## 2020-11-11 DIAGNOSIS — M7918 Myalgia, other site: Secondary | ICD-10-CM | POA: Diagnosis not present

## 2020-11-11 DIAGNOSIS — M533 Sacrococcygeal disorders, not elsewhere classified: Secondary | ICD-10-CM | POA: Diagnosis not present

## 2020-11-11 DIAGNOSIS — M25551 Pain in right hip: Secondary | ICD-10-CM | POA: Diagnosis not present

## 2020-11-11 DIAGNOSIS — M5416 Radiculopathy, lumbar region: Secondary | ICD-10-CM | POA: Diagnosis not present

## 2020-11-12 DIAGNOSIS — R059 Cough, unspecified: Secondary | ICD-10-CM | POA: Diagnosis not present

## 2020-11-12 DIAGNOSIS — R0600 Dyspnea, unspecified: Secondary | ICD-10-CM | POA: Diagnosis not present

## 2020-12-02 DIAGNOSIS — M5442 Lumbago with sciatica, left side: Secondary | ICD-10-CM | POA: Diagnosis not present

## 2020-12-02 DIAGNOSIS — Z1339 Encounter for screening examination for other mental health and behavioral disorders: Secondary | ICD-10-CM | POA: Diagnosis not present

## 2020-12-02 DIAGNOSIS — M5441 Lumbago with sciatica, right side: Secondary | ICD-10-CM | POA: Diagnosis not present

## 2020-12-02 DIAGNOSIS — M5136 Other intervertebral disc degeneration, lumbar region: Secondary | ICD-10-CM | POA: Diagnosis not present

## 2020-12-02 DIAGNOSIS — Z79899 Other long term (current) drug therapy: Secondary | ICD-10-CM | POA: Diagnosis not present

## 2020-12-02 DIAGNOSIS — Z0000 Encounter for general adult medical examination without abnormal findings: Secondary | ICD-10-CM | POA: Diagnosis not present

## 2020-12-02 DIAGNOSIS — Z1331 Encounter for screening for depression: Secondary | ICD-10-CM | POA: Diagnosis not present

## 2020-12-03 DIAGNOSIS — F349 Persistent mood [affective] disorder, unspecified: Secondary | ICD-10-CM | POA: Diagnosis not present

## 2020-12-03 DIAGNOSIS — F064 Anxiety disorder due to known physiological condition: Secondary | ICD-10-CM | POA: Diagnosis not present

## 2020-12-03 DIAGNOSIS — F1320 Sedative, hypnotic or anxiolytic dependence, uncomplicated: Secondary | ICD-10-CM | POA: Diagnosis not present

## 2020-12-03 DIAGNOSIS — G4700 Insomnia, unspecified: Secondary | ICD-10-CM | POA: Diagnosis not present

## 2020-12-04 DIAGNOSIS — R053 Chronic cough: Secondary | ICD-10-CM | POA: Diagnosis not present

## 2020-12-04 DIAGNOSIS — K219 Gastro-esophageal reflux disease without esophagitis: Secondary | ICD-10-CM | POA: Diagnosis not present

## 2020-12-04 DIAGNOSIS — I10 Essential (primary) hypertension: Secondary | ICD-10-CM | POA: Diagnosis not present

## 2020-12-04 DIAGNOSIS — J449 Chronic obstructive pulmonary disease, unspecified: Secondary | ICD-10-CM | POA: Diagnosis not present

## 2020-12-04 DIAGNOSIS — R942 Abnormal results of pulmonary function studies: Secondary | ICD-10-CM | POA: Diagnosis not present

## 2020-12-18 DIAGNOSIS — E1165 Type 2 diabetes mellitus with hyperglycemia: Secondary | ICD-10-CM | POA: Diagnosis not present

## 2020-12-18 DIAGNOSIS — K219 Gastro-esophageal reflux disease without esophagitis: Secondary | ICD-10-CM | POA: Diagnosis not present

## 2020-12-18 DIAGNOSIS — R0602 Shortness of breath: Secondary | ICD-10-CM | POA: Diagnosis not present

## 2020-12-18 DIAGNOSIS — J42 Unspecified chronic bronchitis: Secondary | ICD-10-CM | POA: Diagnosis not present

## 2020-12-30 DIAGNOSIS — G8929 Other chronic pain: Secondary | ICD-10-CM | POA: Diagnosis not present

## 2020-12-30 DIAGNOSIS — R5383 Other fatigue: Secondary | ICD-10-CM | POA: Diagnosis not present

## 2020-12-30 DIAGNOSIS — D539 Nutritional anemia, unspecified: Secondary | ICD-10-CM | POA: Diagnosis not present

## 2020-12-30 DIAGNOSIS — Z79899 Other long term (current) drug therapy: Secondary | ICD-10-CM | POA: Diagnosis not present

## 2020-12-30 DIAGNOSIS — M5136 Other intervertebral disc degeneration, lumbar region: Secondary | ICD-10-CM | POA: Diagnosis not present

## 2020-12-30 DIAGNOSIS — Z1159 Encounter for screening for other viral diseases: Secondary | ICD-10-CM | POA: Diagnosis not present

## 2020-12-30 DIAGNOSIS — M549 Dorsalgia, unspecified: Secondary | ICD-10-CM | POA: Diagnosis not present

## 2020-12-30 DIAGNOSIS — R300 Dysuria: Secondary | ICD-10-CM | POA: Diagnosis not present

## 2020-12-30 DIAGNOSIS — E559 Vitamin D deficiency, unspecified: Secondary | ICD-10-CM | POA: Diagnosis not present

## 2020-12-30 DIAGNOSIS — M129 Arthropathy, unspecified: Secondary | ICD-10-CM | POA: Diagnosis not present

## 2021-01-09 DIAGNOSIS — K219 Gastro-esophageal reflux disease without esophagitis: Secondary | ICD-10-CM | POA: Diagnosis not present

## 2021-01-09 DIAGNOSIS — E611 Iron deficiency: Secondary | ICD-10-CM | POA: Diagnosis not present

## 2021-01-09 DIAGNOSIS — R1319 Other dysphagia: Secondary | ICD-10-CM | POA: Diagnosis not present

## 2021-01-09 DIAGNOSIS — R718 Other abnormality of red blood cells: Secondary | ICD-10-CM | POA: Diagnosis not present

## 2021-01-29 DIAGNOSIS — G8929 Other chronic pain: Secondary | ICD-10-CM | POA: Diagnosis not present

## 2021-01-29 DIAGNOSIS — F411 Generalized anxiety disorder: Secondary | ICD-10-CM | POA: Diagnosis not present

## 2021-01-29 DIAGNOSIS — M5137 Other intervertebral disc degeneration, lumbosacral region: Secondary | ICD-10-CM | POA: Diagnosis not present

## 2021-01-29 DIAGNOSIS — M545 Low back pain, unspecified: Secondary | ICD-10-CM | POA: Diagnosis not present

## 2021-01-29 DIAGNOSIS — M549 Dorsalgia, unspecified: Secondary | ICD-10-CM | POA: Diagnosis not present

## 2021-01-29 DIAGNOSIS — Z79899 Other long term (current) drug therapy: Secondary | ICD-10-CM | POA: Diagnosis not present

## 2021-01-29 DIAGNOSIS — M5136 Other intervertebral disc degeneration, lumbar region: Secondary | ICD-10-CM | POA: Diagnosis not present

## 2021-01-30 DIAGNOSIS — R1319 Other dysphagia: Secondary | ICD-10-CM | POA: Diagnosis not present

## 2021-01-30 DIAGNOSIS — K219 Gastro-esophageal reflux disease without esophagitis: Secondary | ICD-10-CM | POA: Diagnosis not present

## 2021-01-30 DIAGNOSIS — Z01818 Encounter for other preprocedural examination: Secondary | ICD-10-CM | POA: Diagnosis not present

## 2021-01-30 DIAGNOSIS — Z1211 Encounter for screening for malignant neoplasm of colon: Secondary | ICD-10-CM | POA: Diagnosis not present

## 2021-02-03 DIAGNOSIS — F1320 Sedative, hypnotic or anxiolytic dependence, uncomplicated: Secondary | ICD-10-CM | POA: Diagnosis not present

## 2021-02-03 DIAGNOSIS — G4700 Insomnia, unspecified: Secondary | ICD-10-CM | POA: Diagnosis not present

## 2021-02-03 DIAGNOSIS — F349 Persistent mood [affective] disorder, unspecified: Secondary | ICD-10-CM | POA: Diagnosis not present

## 2021-02-03 DIAGNOSIS — F064 Anxiety disorder due to known physiological condition: Secondary | ICD-10-CM | POA: Diagnosis not present

## 2021-02-06 DIAGNOSIS — K900 Celiac disease: Secondary | ICD-10-CM | POA: Diagnosis not present

## 2021-02-06 DIAGNOSIS — K2270 Barrett's esophagus without dysplasia: Secondary | ICD-10-CM | POA: Diagnosis not present

## 2021-02-06 DIAGNOSIS — A048 Other specified bacterial intestinal infections: Secondary | ICD-10-CM | POA: Diagnosis not present

## 2021-02-06 DIAGNOSIS — K2970 Gastritis, unspecified, without bleeding: Secondary | ICD-10-CM | POA: Diagnosis not present

## 2021-02-07 DIAGNOSIS — K200 Eosinophilic esophagitis: Secondary | ICD-10-CM | POA: Diagnosis not present

## 2021-02-28 DIAGNOSIS — G8929 Other chronic pain: Secondary | ICD-10-CM | POA: Diagnosis not present

## 2021-02-28 DIAGNOSIS — M5136 Other intervertebral disc degeneration, lumbar region: Secondary | ICD-10-CM | POA: Diagnosis not present

## 2021-02-28 DIAGNOSIS — M549 Dorsalgia, unspecified: Secondary | ICD-10-CM | POA: Diagnosis not present

## 2021-02-28 DIAGNOSIS — Z6833 Body mass index (BMI) 33.0-33.9, adult: Secondary | ICD-10-CM | POA: Diagnosis not present

## 2021-02-28 DIAGNOSIS — R030 Elevated blood-pressure reading, without diagnosis of hypertension: Secondary | ICD-10-CM | POA: Diagnosis not present

## 2021-02-28 DIAGNOSIS — Z79899 Other long term (current) drug therapy: Secondary | ICD-10-CM | POA: Diagnosis not present

## 2021-03-12 DIAGNOSIS — K219 Gastro-esophageal reflux disease without esophagitis: Secondary | ICD-10-CM | POA: Diagnosis not present

## 2021-03-12 DIAGNOSIS — R1319 Other dysphagia: Secondary | ICD-10-CM | POA: Diagnosis not present

## 2021-03-12 DIAGNOSIS — K591 Functional diarrhea: Secondary | ICD-10-CM | POA: Diagnosis not present

## 2021-03-12 DIAGNOSIS — R159 Full incontinence of feces: Secondary | ICD-10-CM | POA: Diagnosis not present

## 2021-03-13 DIAGNOSIS — M4120 Other idiopathic scoliosis, site unspecified: Secondary | ICD-10-CM | POA: Diagnosis not present

## 2021-03-13 DIAGNOSIS — G8929 Other chronic pain: Secondary | ICD-10-CM | POA: Diagnosis not present

## 2021-03-13 DIAGNOSIS — Z79899 Other long term (current) drug therapy: Secondary | ICD-10-CM | POA: Diagnosis not present

## 2021-03-13 DIAGNOSIS — M5136 Other intervertebral disc degeneration, lumbar region: Secondary | ICD-10-CM | POA: Diagnosis not present

## 2021-03-13 DIAGNOSIS — M549 Dorsalgia, unspecified: Secondary | ICD-10-CM | POA: Diagnosis not present

## 2021-04-08 DIAGNOSIS — G4700 Insomnia, unspecified: Secondary | ICD-10-CM | POA: Diagnosis not present

## 2021-04-08 DIAGNOSIS — F349 Persistent mood [affective] disorder, unspecified: Secondary | ICD-10-CM | POA: Diagnosis not present

## 2021-04-08 DIAGNOSIS — F1320 Sedative, hypnotic or anxiolytic dependence, uncomplicated: Secondary | ICD-10-CM | POA: Diagnosis not present

## 2021-04-08 DIAGNOSIS — F064 Anxiety disorder due to known physiological condition: Secondary | ICD-10-CM | POA: Diagnosis not present

## 2021-04-25 DIAGNOSIS — Z79899 Other long term (current) drug therapy: Secondary | ICD-10-CM | POA: Diagnosis not present

## 2021-04-25 DIAGNOSIS — G8929 Other chronic pain: Secondary | ICD-10-CM | POA: Diagnosis not present

## 2021-04-25 DIAGNOSIS — M5136 Other intervertebral disc degeneration, lumbar region: Secondary | ICD-10-CM | POA: Diagnosis not present

## 2021-04-25 DIAGNOSIS — M549 Dorsalgia, unspecified: Secondary | ICD-10-CM | POA: Diagnosis not present

## 2021-05-13 DIAGNOSIS — H9193 Unspecified hearing loss, bilateral: Secondary | ICD-10-CM | POA: Diagnosis not present

## 2021-05-13 DIAGNOSIS — H903 Sensorineural hearing loss, bilateral: Secondary | ICD-10-CM | POA: Diagnosis not present

## 2021-05-26 DIAGNOSIS — M5136 Other intervertebral disc degeneration, lumbar region: Secondary | ICD-10-CM | POA: Diagnosis not present

## 2021-05-26 DIAGNOSIS — M549 Dorsalgia, unspecified: Secondary | ICD-10-CM | POA: Diagnosis not present

## 2021-05-26 DIAGNOSIS — Z79899 Other long term (current) drug therapy: Secondary | ICD-10-CM | POA: Diagnosis not present

## 2021-05-26 DIAGNOSIS — M25562 Pain in left knee: Secondary | ICD-10-CM | POA: Diagnosis not present

## 2021-05-26 DIAGNOSIS — G8929 Other chronic pain: Secondary | ICD-10-CM | POA: Diagnosis not present

## 2021-05-28 DIAGNOSIS — I890 Lymphedema, not elsewhere classified: Secondary | ICD-10-CM | POA: Diagnosis not present

## 2021-05-28 DIAGNOSIS — G609 Hereditary and idiopathic neuropathy, unspecified: Secondary | ICD-10-CM | POA: Diagnosis not present

## 2021-06-10 DIAGNOSIS — G4700 Insomnia, unspecified: Secondary | ICD-10-CM | POA: Diagnosis not present

## 2021-06-10 DIAGNOSIS — F349 Persistent mood [affective] disorder, unspecified: Secondary | ICD-10-CM | POA: Diagnosis not present

## 2021-06-10 DIAGNOSIS — F1320 Sedative, hypnotic or anxiolytic dependence, uncomplicated: Secondary | ICD-10-CM | POA: Diagnosis not present

## 2021-06-10 DIAGNOSIS — F064 Anxiety disorder due to known physiological condition: Secondary | ICD-10-CM | POA: Diagnosis not present

## 2021-06-24 DIAGNOSIS — R300 Dysuria: Secondary | ICD-10-CM | POA: Diagnosis not present

## 2021-06-24 DIAGNOSIS — M5442 Lumbago with sciatica, left side: Secondary | ICD-10-CM | POA: Diagnosis not present

## 2021-06-24 DIAGNOSIS — Z79899 Other long term (current) drug therapy: Secondary | ICD-10-CM | POA: Diagnosis not present

## 2021-06-24 DIAGNOSIS — E7800 Pure hypercholesterolemia, unspecified: Secondary | ICD-10-CM | POA: Diagnosis not present

## 2021-06-24 DIAGNOSIS — R5383 Other fatigue: Secondary | ICD-10-CM | POA: Diagnosis not present

## 2021-06-24 DIAGNOSIS — M5441 Lumbago with sciatica, right side: Secondary | ICD-10-CM | POA: Diagnosis not present

## 2021-06-24 DIAGNOSIS — Z23 Encounter for immunization: Secondary | ICD-10-CM | POA: Diagnosis not present

## 2021-06-24 DIAGNOSIS — Z1159 Encounter for screening for other viral diseases: Secondary | ICD-10-CM | POA: Diagnosis not present

## 2021-06-24 DIAGNOSIS — D539 Nutritional anemia, unspecified: Secondary | ICD-10-CM | POA: Diagnosis not present

## 2021-06-24 DIAGNOSIS — E559 Vitamin D deficiency, unspecified: Secondary | ICD-10-CM | POA: Diagnosis not present

## 2021-06-24 DIAGNOSIS — M5136 Other intervertebral disc degeneration, lumbar region: Secondary | ICD-10-CM | POA: Diagnosis not present

## 2021-06-24 DIAGNOSIS — M129 Arthropathy, unspecified: Secondary | ICD-10-CM | POA: Diagnosis not present

## 2021-07-16 DIAGNOSIS — R159 Full incontinence of feces: Secondary | ICD-10-CM | POA: Diagnosis not present

## 2021-07-16 DIAGNOSIS — K219 Gastro-esophageal reflux disease without esophagitis: Secondary | ICD-10-CM | POA: Diagnosis not present

## 2021-07-24 DIAGNOSIS — F32A Depression, unspecified: Secondary | ICD-10-CM | POA: Diagnosis not present

## 2021-07-24 DIAGNOSIS — M62830 Muscle spasm of back: Secondary | ICD-10-CM | POA: Diagnosis not present

## 2021-07-24 DIAGNOSIS — M549 Dorsalgia, unspecified: Secondary | ICD-10-CM | POA: Diagnosis not present

## 2021-07-24 DIAGNOSIS — M5136 Other intervertebral disc degeneration, lumbar region: Secondary | ICD-10-CM | POA: Diagnosis not present

## 2021-07-24 DIAGNOSIS — Z79899 Other long term (current) drug therapy: Secondary | ICD-10-CM | POA: Diagnosis not present

## 2021-08-25 DIAGNOSIS — Z79899 Other long term (current) drug therapy: Secondary | ICD-10-CM | POA: Diagnosis not present

## 2021-08-25 DIAGNOSIS — E7800 Pure hypercholesterolemia, unspecified: Secondary | ICD-10-CM | POA: Diagnosis not present

## 2021-08-25 DIAGNOSIS — N183 Chronic kidney disease, stage 3 unspecified: Secondary | ICD-10-CM | POA: Diagnosis not present

## 2021-08-25 DIAGNOSIS — M545 Low back pain, unspecified: Secondary | ICD-10-CM | POA: Diagnosis not present

## 2021-08-25 DIAGNOSIS — F331 Major depressive disorder, recurrent, moderate: Secondary | ICD-10-CM | POA: Diagnosis not present

## 2021-09-24 DIAGNOSIS — F331 Major depressive disorder, recurrent, moderate: Secondary | ICD-10-CM | POA: Diagnosis not present

## 2021-09-24 DIAGNOSIS — Z79899 Other long term (current) drug therapy: Secondary | ICD-10-CM | POA: Diagnosis not present

## 2021-09-24 DIAGNOSIS — N183 Chronic kidney disease, stage 3 unspecified: Secondary | ICD-10-CM | POA: Diagnosis not present

## 2021-09-24 DIAGNOSIS — M5136 Other intervertebral disc degeneration, lumbar region: Secondary | ICD-10-CM | POA: Diagnosis not present

## 2021-09-24 DIAGNOSIS — G4700 Insomnia, unspecified: Secondary | ICD-10-CM | POA: Diagnosis not present

## 2022-02-10 ENCOUNTER — Other Ambulatory Visit (HOSPITAL_COMMUNITY): Payer: Self-pay | Admitting: *Deleted

## 2022-02-10 DIAGNOSIS — R079 Chest pain, unspecified: Secondary | ICD-10-CM

## 2022-03-05 ENCOUNTER — Telehealth (HOSPITAL_COMMUNITY): Payer: Self-pay | Admitting: *Deleted

## 2022-03-05 NOTE — Telephone Encounter (Signed)
Reaching out to patient to offer assistance regarding upcoming cardiac imaging study; pt verbalizes understanding of appt date/time, parking situation and where to check in, pre-test NPO status and verified current allergies; name and call back number provided for further questions should they arise  Gordy Clement RN Navigator Cardiac Imaging [facility] [phone] office [phone] cell  Patient to take her daily medications but will hold her losartan. She is aware to arrive at 1:30pm.

## 2022-03-06 ENCOUNTER — Ambulatory Visit (HOSPITAL_COMMUNITY)
Admission: RE | Admit: 2022-03-06 | Discharge: 2022-03-06 | Disposition: A | Discharge status: Home / Self Care | Payer: PPO | Source: Ambulatory Visit | Attending: Family Medicine | Admitting: Family Medicine

## 2022-03-06 DIAGNOSIS — R079 Chest pain, unspecified: Secondary | ICD-10-CM | POA: Diagnosis not present

## 2022-03-06 MED ORDER — IOHEXOL 350 MG/ML SOLN
100.0000 mL | Freq: Once | INTRAVENOUS | Status: AC | PRN
  Administered 2022-03-06: 100 mL via INTRAVENOUS

## 2022-03-06 MED ORDER — NITROGLYCERIN 0.4 MG SL SUBL
SUBLINGUAL_TABLET | SUBLINGUAL | Status: AC
  Filled 2022-03-06: qty 2

## 2022-03-06 MED ORDER — NITROGLYCERIN 0.4 MG SL SUBL
0.8000 mg | SUBLINGUAL_TABLET | Freq: Once | SUBLINGUAL | Status: AC
  Administered 2022-03-06: 0.8 mg via SUBLINGUAL

## 2022-06-28 IMAGING — CT CT HEART MORP W/ CTA COR W/ SCORE W/ CA W/CM &/OR W/O CM
4 of 7 series · 8 of 20 positions shown, 9 images · IV contrast (APPLIED)
Comparison: 09/02/2019.
COMPARISON: 09/02/2019.

Addendum:
EXAM:
OVER-READ INTERPRETATION  CT CHEST

The following report is a limited chest CT over-read performed by
radiologist Dr. Jadeja Guchhait [REDACTED] on
03/06/2022. The interpretation by the cardiologist is attached.
CLINICAL DATA: Chest pain
Cardiac CTA
MEDICATIONS:
Sub lingual nitro. 4 mg and lopressor 100mg
TECHNIQUE: The patient was scanned on a Siemens Force [id number] scanner. Gantry
rotation speed was 250 msecs. Collimation was. 6 mm . A 120 kV
prospective scan was triggered in the ascending thoracic aorta at
140 HU's with full mA between 30-70% of the R-R interval . Average
HR during the scan was 68 bpm. The 3D data set was interpreted on a
dedicated work station using MPR, MIP and VRT modes. A total of 80
cc of contrast was used.

[Series 6: ts diast sharp · axial · 0.40mm/px · z∈[-170,-132]mm · 2 of 286 slices shown]
[im 96/286  lung]
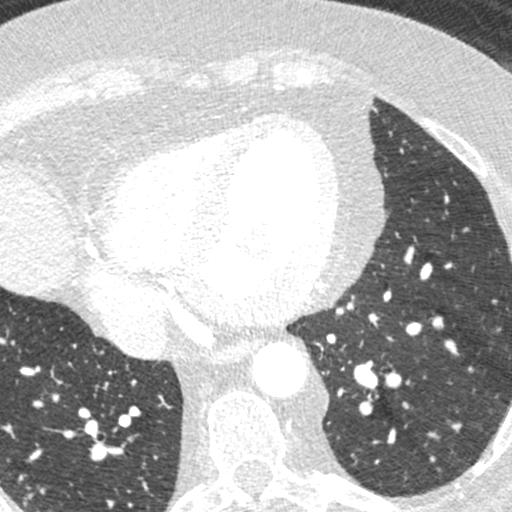
[im 191/286  lung]
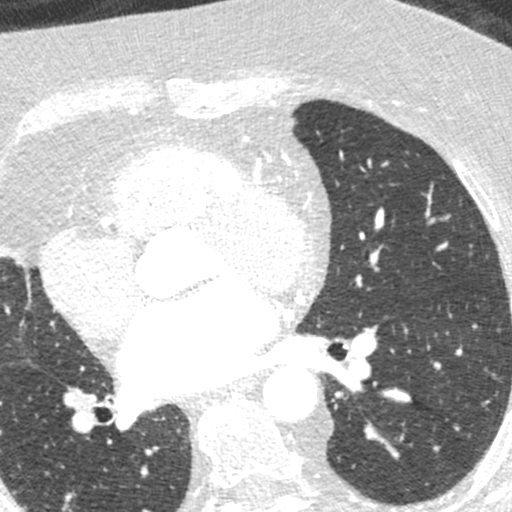

[Series 7: ts syst sharp · axial · 0.40mm/px · z∈[-170,-132]mm · 2 of 286 slices shown]
[im 96/286  lung]
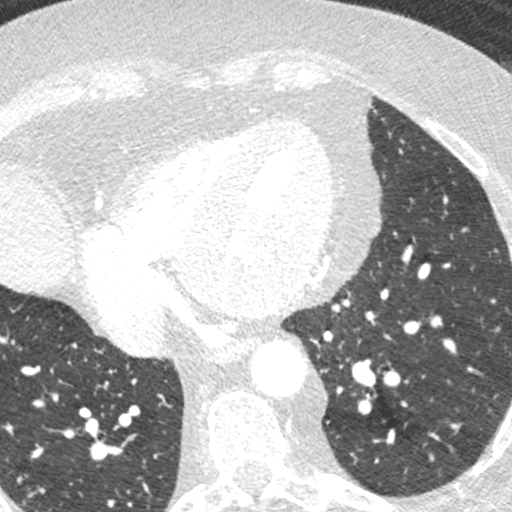
[im 191/286  lung]
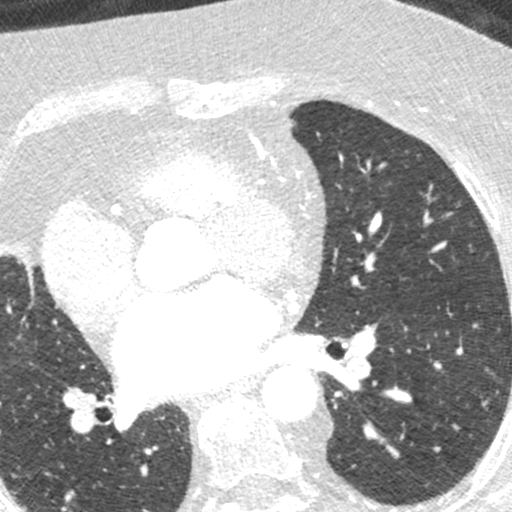

[Series 8: best syst · axial · 0.40mm/px · z∈[-170,-132]mm · 2 of 286 slices shown, 3 images]
[im 96/286  vessel]
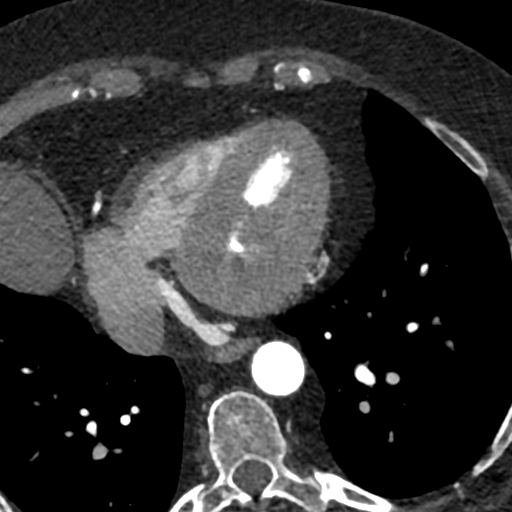
[im 96/286  lung]
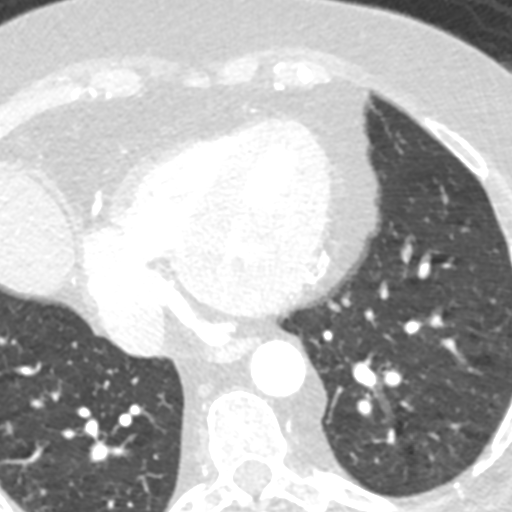
[im 191/286  vessel]
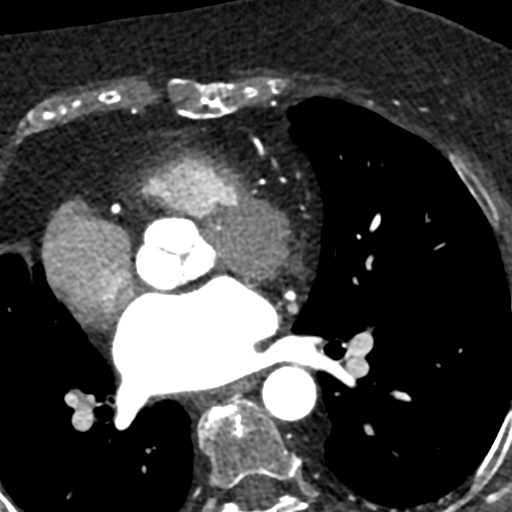

[Series 9: best diast · axial · 0.40mm/px · z∈[-170,-132]mm · 2 of 286 slices shown]
[im 96/286  vessel]
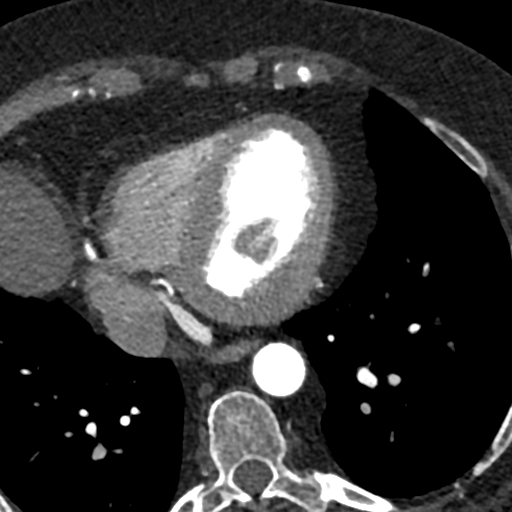
[im 191/286  vessel]
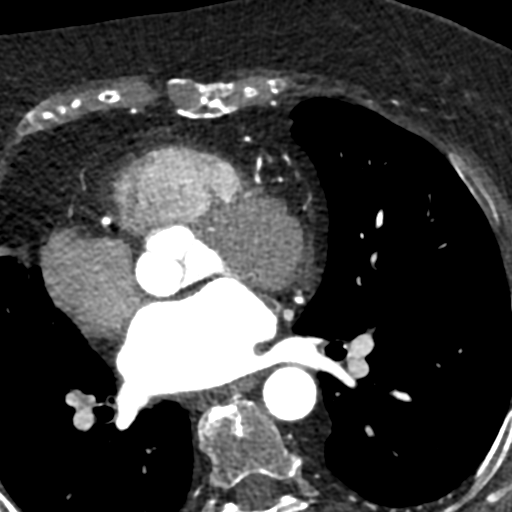

[8 of 20 positions shown; findings below may reference images not displayed]

FINDINGS: Vascular: None.

Mediastinum/Nodes: None.

Lungs/Pleura: Scattered pulmonary parenchymal scarring. 3 mm
posterior right middle lobe nodule ([DATE]), as on 07/12/2018 and
therefore benign.

Upper Abdomen: None.

Musculoskeletal: Degenerative changes in the spine.
IMPRESSION: No acute extracardiac findings.
FINDINGS: Non-cardiac: See separate report from [REDACTED]. No
significant findings on limited lung and soft tissue windows.

Calcium score: No calcium noted

Coronary Arteries: Right dominant with no anomalies

LM: Normal

LAD: Normal

D1: Normal

D2: Normal

Circumflex: Normal

OM1: Normal

AV Groove : Normal

RCA: Normal

PDA: Normal

PLA: Normal
IMPRESSION: 1. Calcium score 0

2.  Normal right dominant coronary arteries

3.  Normal ascending thoracic aorta 3.0 cm

Lkw Tiger

*** End of Addendum ***
EXAM:
OVER-READ INTERPRETATION  CT CHEST

The following report is a limited chest CT over-read performed by
radiologist Dr. Jadeja Guchhait [REDACTED] on
03/06/2022. The interpretation by the cardiologist is attached.
FINDINGS: Vascular: None.

Mediastinum/Nodes: None.

Lungs/Pleura: Scattered pulmonary parenchymal scarring. 3 mm
posterior right middle lobe nodule ([DATE]), as on 07/12/2018 and
therefore benign.

Upper Abdomen: None.

Musculoskeletal: Degenerative changes in the spine.
IMPRESSION: No acute extracardiac findings.
